# Patient Record
Sex: Male | Born: 1965 | Race: White | Hispanic: No | Marital: Married | Smoking: Current every day smoker
Health system: Southern US, Community
[De-identification: ages and names within clinical notes are randomized; demographics above are authoritative.]

## PROBLEM LIST (undated history)

## (undated) DIAGNOSIS — F509 Eating disorder, unspecified: Secondary | ICD-10-CM

## (undated) DIAGNOSIS — I2510 Atherosclerotic heart disease of native coronary artery without angina pectoris: Secondary | ICD-10-CM

## (undated) DIAGNOSIS — Z87898 Personal history of other specified conditions: Secondary | ICD-10-CM

## (undated) DIAGNOSIS — I639 Cerebral infarction, unspecified: Secondary | ICD-10-CM

## (undated) DIAGNOSIS — F319 Bipolar disorder, unspecified: Secondary | ICD-10-CM

## (undated) DIAGNOSIS — E291 Testicular hypofunction: Secondary | ICD-10-CM

## (undated) DIAGNOSIS — F4310 Post-traumatic stress disorder, unspecified: Secondary | ICD-10-CM

## (undated) DIAGNOSIS — F32A Depression, unspecified: Secondary | ICD-10-CM

## (undated) DIAGNOSIS — I219 Acute myocardial infarction, unspecified: Secondary | ICD-10-CM

## (undated) DIAGNOSIS — Z800 Family history of malignant neoplasm of digestive organs: Secondary | ICD-10-CM

## (undated) DIAGNOSIS — F419 Anxiety disorder, unspecified: Secondary | ICD-10-CM

## (undated) DIAGNOSIS — I10 Essential (primary) hypertension: Secondary | ICD-10-CM

## (undated) DIAGNOSIS — Z860100 Personal history of colon polyps, unspecified: Secondary | ICD-10-CM

## (undated) DIAGNOSIS — K219 Gastro-esophageal reflux disease without esophagitis: Secondary | ICD-10-CM

## (undated) DIAGNOSIS — E041 Nontoxic single thyroid nodule: Secondary | ICD-10-CM

## (undated) HISTORY — DX: Atherosclerotic heart disease of native coronary artery without angina pectoris: I25.10

## (undated) HISTORY — PX: ROTATOR CUFF REPAIR: SHX139

## (undated) HISTORY — DX: Depression, unspecified: F32.A

## (undated) HISTORY — DX: Personal history of colon polyps, unspecified: Z86.0100

## (undated) HISTORY — PX: UPPER GASTROINTESTINAL ENDOSCOPY: SHX188

## (undated) HISTORY — DX: Anxiety disorder, unspecified: F41.9

## (undated) HISTORY — DX: Family history of malignant neoplasm of digestive organs: Z80.0

## (undated) HISTORY — PX: COLONOSCOPY: SHX174

## (undated) HISTORY — DX: Eating disorder, unspecified: F50.9

## (undated) HISTORY — DX: Gastro-esophageal reflux disease without esophagitis: K21.9

## (undated) HISTORY — PX: CORONARY ANGIOPLASTY WITH STENT PLACEMENT: SHX49

## (undated) HISTORY — DX: Bipolar disorder, unspecified: F31.9

## (undated) HISTORY — DX: Personal history of colonic polyps: Z86.010

## (undated) HISTORY — DX: Testicular hypofunction: E29.1

## (undated) HISTORY — DX: Nontoxic single thyroid nodule: E04.1

---

## 1996-04-25 HISTORY — PX: APPENDECTOMY: SHX54

## 2014-07-01 LAB — HM COLONOSCOPY

## 2019-02-21 ENCOUNTER — Other Ambulatory Visit: Payer: Self-pay

## 2019-02-21 ENCOUNTER — Emergency Department (HOSPITAL_COMMUNITY): Payer: Managed Care, Other (non HMO)

## 2019-02-21 ENCOUNTER — Encounter (HOSPITAL_COMMUNITY): Payer: Self-pay | Admitting: Physician Assistant

## 2019-02-21 ENCOUNTER — Emergency Department (HOSPITAL_COMMUNITY)
Admission: EM | Admit: 2019-02-21 | Discharge: 2019-02-21 | Disposition: A | Discharge status: Home / Self Care | Payer: Managed Care, Other (non HMO) | Attending: Emergency Medicine | Admitting: Emergency Medicine

## 2019-02-21 DIAGNOSIS — R5383 Other fatigue: Secondary | ICD-10-CM

## 2019-02-21 DIAGNOSIS — Z20828 Contact with and (suspected) exposure to other viral communicable diseases: Secondary | ICD-10-CM | POA: Diagnosis not present

## 2019-02-21 DIAGNOSIS — R509 Fever, unspecified: Secondary | ICD-10-CM | POA: Diagnosis present

## 2019-02-21 DIAGNOSIS — Z20822 Contact with and (suspected) exposure to covid-19: Secondary | ICD-10-CM

## 2019-02-21 LAB — COMPREHENSIVE METABOLIC PANEL
ALT: 18 U/L (ref 0–44)
AST: 18 U/L (ref 15–41)
Albumin: 4.2 g/dL (ref 3.5–5.0)
Alkaline Phosphatase: 66 U/L (ref 38–126)
Anion gap: 8 (ref 5–15)
BUN: 11 mg/dL (ref 6–20)
CO2: 24 mmol/L (ref 22–32)
Calcium: 9.6 mg/dL (ref 8.9–10.3)
Chloride: 109 mmol/L (ref 98–111)
Creatinine, Ser: 1.29 mg/dL — ABNORMAL HIGH (ref 0.61–1.24)
GFR calc Af Amer: >60 mL/min (ref >60)
GFR calc non Af Amer: >60 mL/min (ref >60)
Glucose, Bld: 85 mg/dL (ref 70–99)
Potassium: 4.1 mmol/L (ref 3.5–5.1)
Sodium: 141 mmol/L (ref 135–145)
Total Bilirubin: 0.6 mg/dL (ref 0.3–1.2)
Total Protein: 6.1 g/dL — ABNORMAL LOW (ref 6.5–8.1)

## 2019-02-21 LAB — CBC WITH DIFFERENTIAL/PLATELET
Abs Immature Granulocytes: 0.03 10*3/uL (ref 0.00–0.07)
Basophils Absolute: 0.1 10*3/uL (ref 0.0–0.1)
Basophils Relative: 1 %
Eosinophils Absolute: 0.2 10*3/uL (ref 0.0–0.5)
Eosinophils Relative: 2 %
HCT: 44.3 % (ref 39.0–52.0)
Hemoglobin: 14 g/dL (ref 13.0–17.0)
Immature Granulocytes: 0 %
Lymphocytes Relative: 22 %
Lymphs Abs: 1.9 10*3/uL (ref 0.7–4.0)
MCH: 29.9 pg (ref 26.0–34.0)
MCHC: 31.6 g/dL (ref 30.0–36.0)
MCV: 94.7 fL (ref 80.0–100.0)
Monocytes Absolute: 0.6 10*3/uL (ref 0.1–1.0)
Monocytes Relative: 7 %
Neutro Abs: 5.8 10*3/uL (ref 1.7–7.7)
Neutrophils Relative %: 68 %
Platelets: 327 10*3/uL (ref 150–400)
RBC: 4.68 MIL/uL (ref 4.22–5.81)
RDW: 14.1 % (ref 11.5–15.5)
WBC: 8.5 10*3/uL (ref 4.0–10.5)
nRBC: 0 % (ref 0.0–0.2)

## 2019-02-21 LAB — TROPONIN I (HIGH SENSITIVITY): Troponin I (High Sensitivity): 2 ng/L (ref <18)

## 2019-02-21 LAB — LITHIUM LEVEL: Lithium Lvl: 0.62 mmol/L (ref 0.60–1.20)

## 2019-02-21 LAB — D-DIMER, QUANTITATIVE: D-Dimer, Quant: <0.27 ug/mL-FEU (ref 0.00–0.50)

## 2019-02-21 MED ORDER — SODIUM CHLORIDE 0.9 % IV BOLUS
1000.0000 mL | Freq: Once | INTRAVENOUS | Status: AC
  Administered 2019-02-21: 1000 mL via INTRAVENOUS

## 2019-02-21 MED ORDER — KETOROLAC TROMETHAMINE 30 MG/ML IJ SOLN
30.0000 mg | Freq: Once | INTRAMUSCULAR | Status: AC
  Administered 2019-02-21: 30 mg via INTRAVENOUS
  Filled 2019-02-21: qty 1

## 2019-02-21 NOTE — ED Triage Notes (Signed)
Pt to ER for evaluation of fever and fatigue with nasal congestion. Reports travels for work. Became ill with symptoms on Friday, was tested for COVID Sunday but has not gotten the results.

## 2019-02-21 NOTE — ED Notes (Signed)
Pt dc'd home w/all belongings, a/o x4, ambulatory on dc

## 2019-02-21 NOTE — Discharge Instructions (Signed)
Today you were were tested for the coronavirus.  The results will be available in the next 3 to 5 days.  If the results are positive the hospital will contact you.  If they are negative the hospital would not contact you.  You will need to self quarantine until you are aware of your results.  If they are positive you will need to self quarantine as directed below.  You should be isolated for at least 7 days since the onset of your symptoms AND >72 hours after symptoms resolution (absence of fever without the use of fever reducing medication and improvement in respiratory symptoms), whichever is longer  Please follow up with your primary care provider within 5-7 days for re-evaluation of your symptoms. If you do not have a primary care provider, information for a healthcare clinic has been provided for you to make arrangements for follow up care. Please return to the emergency department for any new or worsening symptoms.

## 2019-02-21 NOTE — ED Provider Notes (Signed)
[facility] EMERGENCY DEPARTMENT Provider Note   CSN: [id number] Arrival date & time: 02/21/19  1334     History   Chief Complaint Chief Complaint  Patient presents with  . Fever  . Fatigue    HPI Louis Cardenas is a 53 y.o. male.     HPI   Pt is a 53 y/o male with a h/o bipolar disorder, bulimia, MI (at age 17), sinusitis, who presents to the ED today for eval of fatigue, body aches, fever (resolved 5 days ago), that started about a week ago. He started having nasal congestion about 2 weeks ago.   He also c/o right back pain/right chest pain that started last night. It has been intermittent. Pain feels like "I got punched".  No associated with exertion. Denies pain currently.  Denies SOB, pleuritic pain or cough. Has had similar pain in the past that resolved on its own. He thinks he may have slept wrong. Pain worse when lifting up the right arm sometimes.  He was tested for COVID 10/25 but does not have the results yet.    States that he traveled for 2.5 weeks recently. States that he has driven 2-3 hours max at a time during this period. He has been around people who have had family members that were positive for COVID. Denies leg pain/swelling, hemoptysis, recent surgery/trauma, hormone use, personal hx of cancer, or hx of DVT/PE.    History reviewed. No pertinent past medical history.  There are no active problems to display for this patient.   History reviewed. No pertinent surgical history.      Home Medications    Prior to Admission medications   Not on File    Family History History reviewed. No pertinent family history.  Social History Social History   Tobacco Use  . Smoking status: Not on file  Substance Use Topics  . Alcohol use: Not on file  . Drug use: Not on file     Allergies   Patient has no allergy information on record.   Review of Systems Review of Systems  Constitutional: Positive for fever.  HENT:  Positive for congestion. Negative for ear pain and sore throat.   Eyes: Negative for pain and visual disturbance.  Respiratory: Positive for cough. Negative for shortness of breath.   Cardiovascular: Positive for chest pain.  Gastrointestinal: Positive for nausea. Negative for abdominal pain, constipation, diarrhea and vomiting.  Genitourinary: Negative for dysuria and hematuria.  Musculoskeletal: Positive for back pain.  Skin: Negative for rash.  Neurological: Negative for headaches.  All other systems reviewed and are negative.    Physical Exam Updated Vital Signs BP 118/75 (BP Location: Right Arm)   Pulse 65   Temp 98.7 F (37.1 C) (Oral)   Resp 18   SpO2 99%   Physical Exam Vitals signs and nursing note reviewed.  Constitutional:      Appearance: He is well-developed. He is not ill-appearing.  HENT:     Head: Normocephalic and atraumatic.  Eyes:     Conjunctiva/sclera: Conjunctivae normal.  Neck:     Musculoskeletal: Neck supple.  Cardiovascular:     Rate and Rhythm: Normal rate and regular rhythm.     Pulses: Normal pulses.     Heart sounds: Normal heart sounds. No murmur.  Pulmonary:     Effort: Pulmonary effort is normal. No respiratory distress.     Breath sounds: Normal breath sounds. No wheezing, rhonchi or rales.  Chest:  Chest wall: No tenderness.  Abdominal:     General: Bowel sounds are normal.     Palpations: Abdomen is soft.     Tenderness: There is no abdominal tenderness. There is no guarding or rebound.  Musculoskeletal:     Comments: Mild ttp inferior to the right scapula  Skin:    General: Skin is warm and dry.  Neurological:     Mental Status: He is alert.     Comments: Clear speech, moving all extremities      ED Treatments / Results  Labs (all labs ordered are listed, but only abnormal results are displayed) Labs Reviewed  COMPREHENSIVE METABOLIC PANEL - Abnormal; Notable for the following components:      Result Value    Creatinine, Ser 1.29 (*)    Total Protein 6.1 (*)    All other components within normal limits  NOVEL CORONAVIRUS, NAA (HOSP ORDER, SEND-OUT TO REF LAB; TAT 18-24 HRS)  D-DIMER, QUANTITATIVE (NOT AT [facility])  LITHIUM LEVEL  CBC WITH DIFFERENTIAL/PLATELET  TROPONIN I (HIGH SENSITIVITY)    EKG None  Radiology Dg Chest Portable 1 View  Result Date: 02/21/2019 CLINICAL DATA:  Fever and fatigue EXAM: PORTABLE CHEST 1 VIEW COMPARISON:  None. FINDINGS: The heart size and mediastinal contours are within normal limits. Both lungs are clear. The visualized skeletal structures are unremarkable. IMPRESSION: No active disease. Electronically Signed   By: Abelardo Diesel M.D.   On: 02/21/2019 16:18    Procedures Procedures (including critical care time)  Medications Ordered in ED Medications  sodium chloride 0.9 % bolus 1,000 mL (0 mLs Intravenous Stopped 02/21/19 1742)  ketorolac (TORADOL) 30 MG/ML injection 30 mg (30 mg Intravenous Given 02/21/19 1624)     Initial Impression / Assessment and Plan / ED Course  I have reviewed the triage vital signs and the nursing notes.  Pertinent labs & imaging results that were available during my care of the patient were reviewed by me and considered in my medical decision making (see chart for details).     Final Clinical Impressions(s) / ED Diagnoses   Final diagnoses:  Fatigue, unspecified type  Suspected COVID-19 virus infection   53 year old male presenting for evaluation of fevers and fatigue for the last week.  Is concerned about Covid exposure and has Covid test pending at this time.  His exam is reassuring as well as his vital signs.  Reviewed labs, CBC, CMP, troponin, D-dimer, lithium level are all reassuring. EKG is nonischemic, CXR is negative.   His covid test resulted while in the ED and was negative however he is requesting retesting as he is still concerned. Will send retest. Advised on pcp f/u and quarantine measures. Advised on  return precautions. He voices understanding and is in agreement, all questions answered, pt stable for d/c.   ED Discharge Orders    None       Rodney Booze, PA-C 02/21/19 [place], Ankit, MD 02/21/19 2340

## 2019-02-23 LAB — NOVEL CORONAVIRUS, NAA (HOSP ORDER, SEND-OUT TO REF LAB; TAT 18-24 HRS): SARS-CoV-2, NAA: NOT DETECTED

## 2019-06-05 LAB — BASIC METABOLIC PANEL
BUN: 16 (ref 4–21)
Creatinine: 1.4 — AB (ref 0.6–1.3)

## 2019-06-05 LAB — TSH: TSH: 0.58 (ref 0.41–5.90)

## 2019-07-22 LAB — T4, FREE: Free T4: 1.32

## 2019-07-22 LAB — TESTOSTERONE: Testosterone: 546

## 2019-07-22 LAB — TESTOSTERONE, FREE: Free Testosterone: 7.2

## 2019-07-22 LAB — T3: T3, Total: 21

## 2019-07-24 ENCOUNTER — Other Ambulatory Visit: Payer: Self-pay

## 2019-07-29 ENCOUNTER — Encounter: Payer: Self-pay | Admitting: Endocrinology

## 2019-07-29 ENCOUNTER — Ambulatory Visit (INDEPENDENT_AMBULATORY_CARE_PROVIDER_SITE_OTHER): Payer: BC Managed Care – PPO | Admitting: Endocrinology

## 2019-07-29 ENCOUNTER — Other Ambulatory Visit: Payer: Self-pay

## 2019-07-29 DIAGNOSIS — E041 Nontoxic single thyroid nodule: Secondary | ICD-10-CM | POA: Diagnosis not present

## 2019-07-29 NOTE — Progress Notes (Signed)
Subjective:    Patient ID: Louis Cardenas, male    DOB: 1966-04-02, 54 y.o.   MRN: [id number]  HPI Pt is referred by Dr Lenard Simmer, for nodular thyroid.  Pt was noted to have a thyroid nodule in early 2021.  He is unaware of ever having had thyroid problems in the past.  He has no h/o XRT or surgery to the neck.  He has slight neck pain.  Past Medical History:  Diagnosis Date  . Thyroid nodule     No past surgical history on file.  Social History   Socioeconomic History  . Marital status: Married    Spouse name: Not on file  . Number of children: Not on file  . Years of education: Not on file  . Highest education level: Not on file  Occupational History  . Not on file  Tobacco Use  . Smoking status: Current Every Day Smoker    Packs/day: 0.50    Types: Cigarettes  . Smokeless tobacco: Never Used  Substance and Sexual Activity  . Alcohol use: Yes    Comment: socially  . Drug use: Yes    Types: Marijuana  . Sexual activity: Not Currently  Other Topics Concern  . Not on file  Social History Narrative  . Not on file   Social Determinants of Health   Financial Resource Strain:   . Difficulty of Paying Living Expenses:   Food Insecurity:   . Worried About Charity fundraiser in the Last Year:   . Arboriculturist in the Last Year:   Transportation Needs:   . Film/video editor (Medical):   Marland Kitchen Lack of Transportation (Non-Medical):   Physical Activity:   . Days of Exercise per Week:   . Minutes of Exercise per Session:   Stress:   . Feeling of Stress :   Social Connections:   . Frequency of Communication with Friends and Family:   . Frequency of Social Gatherings with Friends and Family:   . Attends Religious Services:   . Active Member of Clubs or Organizations:   . Attends Archivist Meetings:   Marland Kitchen Marital Status:   Intimate Partner Violence:   . Fear of Current or Ex-Partner:   . Emotionally Abused:   Marland Kitchen Physically Abused:   . Sexually Abused:      Current Outpatient Medications on File Prior to Visit  Medication Sig Dispense Refill  . aspirin 81 MG chewable tablet Chew by mouth daily.    Marland Kitchen atorvastatin (LIPITOR) 40 MG tablet Take 40 mg by mouth daily.    . clonazePAM (KLONOPIN) 0.5 MG tablet Take 0.5 mg by mouth as needed for anxiety.    . clopidogrel (PLAVIX) 75 MG tablet Take 75 mg by mouth daily.    Marland Kitchen lamoTRIgine (LAMICTAL) 200 MG tablet Take 200 mg by mouth daily.    Marland Kitchen lithium 300 MG tablet Take 300 mg by mouth daily.     . nitroGLYCERIN (NITROSTAT) 0.4 MG SL tablet Place 0.4 mg under the tongue every 5 (five) minutes as needed for chest pain.    . pantoprazole (PROTONIX) 40 MG tablet Take 40 mg by mouth daily.    . sildenafil (VIAGRA) 100 MG tablet Take 100 mg by mouth daily as needed for erectile dysfunction.    . Testosterone 40.5 MG/2.5GM (1.62%) GEL Place onto the skin daily. Apply 1 packet daily     No current facility-administered medications on file prior to visit.  No Known Allergies  Family History  Problem Relation Age of Onset  . Thyroid disease Mother     BP 100/70   Pulse 63   Ht 5\' 6"  (1.676 m)   Wt 138 lb 3.2 oz (62.7 kg)   SpO2 98%   BMI 22.31 kg/m    Review of Systems Denies hoarseness, sob, cough, dysphagia, diarrhea, and flushing.  He reports cold intolerance, difficulty with concentration, fatigue, tremor, and headache.  He has lost 100 lbs, due to his efforts (he has also been dx'ed with bulemia).     Objective:   Physical Exam VS: see vs page GEN: no distress HEAD: head: no deformity eyes: no periorbital swelling, no proptosis external nose and ears are normal NECK: supple, thyroid is not enlarged.  I cannot appreciate the nodule. CHEST WALL: no deformity LUNGS: clear to auscultation CV: reg rate and rhythm, no murmur.  MUSCULOSKELETAL: muscle bulk and strength are grossly normal.  no obvious joint swelling.  gait is normal and steady EXTEMITIES: no deformity.  no edema PULSES:  no carotid bruit NEURO:  cn 2-12 grossly intact.   readily moves all 4's.  sensation is intact to touch on all 4's SKIN:  Normal texture and temperature.  No rash or suspicious lesion is visible.   NODES:  None palpable at the neck PSYCH: alert, well-oriented.  Does not appear anxious nor depressed.     Lab Results  Component Value Date   TSH 0.58 06/05/2019   T3TOTAL 21 06/05/2019   I have reviewed outside records, and summarized:  Pt was noted to have a goiter when he lived in [place], and was told to follow up here in [place].  He reported sxs of tremor, unsteadiness of gait, and memory loss.    [place]: RLP nodule, 1 cm diameter.      Assessment & Plan:  Thyroid nodule, new to me.  Cold intolerance, and other sxs.   Patient Instructions  You don't have to worry about the thyroid causing your symptoms. Please come back for a follow-up appointment in 1 year.   most of the time, a "lumpy thyroid" will eventually become overactive.  this is usually a slow process, happening over the span of many years.

## 2019-07-29 NOTE — Patient Instructions (Signed)
You don't have to worry about the thyroid causing your symptoms. Please come back for a follow-up appointment in 1 year.   most of the time, a "lumpy thyroid" will eventually become overactive.  this is usually a slow process, happening over the span of many years.

## 2019-08-01 ENCOUNTER — Encounter: Payer: Self-pay | Admitting: Endocrinology

## 2019-08-01 DIAGNOSIS — E041 Nontoxic single thyroid nodule: Secondary | ICD-10-CM | POA: Insufficient documentation

## 2019-10-02 ENCOUNTER — Other Ambulatory Visit: Payer: Self-pay

## 2019-10-02 ENCOUNTER — Ambulatory Visit (INDEPENDENT_AMBULATORY_CARE_PROVIDER_SITE_OTHER): Payer: BC Managed Care – PPO | Admitting: Internal Medicine

## 2019-10-02 VITALS — BP 116/70 | HR 64 | Temp 97.8°F | Ht 66.0 in | Wt 139.2 lb

## 2019-10-02 DIAGNOSIS — M25511 Pain in right shoulder: Secondary | ICD-10-CM

## 2019-10-02 DIAGNOSIS — M542 Cervicalgia: Secondary | ICD-10-CM

## 2019-10-02 DIAGNOSIS — I25119 Atherosclerotic heart disease of native coronary artery with unspecified angina pectoris: Secondary | ICD-10-CM

## 2019-10-02 MED ORDER — CYCLOBENZAPRINE HCL 10 MG PO TABS: 10.0000 mg | ORAL_TABLET | Freq: Three times a day (TID) | ORAL | 0 refills | Status: DC | PRN

## 2019-10-02 MED ORDER — DICLOFENAC SODIUM 1 % EX GEL: 4.0000 g | Freq: Four times a day (QID) | CUTANEOUS | 0 refills | Status: DC

## 2019-10-02 NOTE — Assessment & Plan Note (Addendum)
First visit in [facility] 2. Rt shoulder/neck pain is a new problem.  His pain is started about a week ago, it is sharp pain at rt shoulder blade and lateral of neck without any known trauma that started gradually at the night after he drove a car for about 3 hours.  It got worse in the morning and he has associated numbness in first 3 digits. he thinks he had so much stress in his life recently and believes it caused his muscle gets tense.  No weakness.  His symptoms have been constant. Tylenol helps but it is temporary.   His presentation is suggestive of cervicalgia. Will manage conservatively with muscle relaxant and pain control/anti-inflammatory. -Flexeril 10 mg 3 times daily as needed for a week -Voltaren gel as needed -Follow-up in clinic if no improvement in 2 weeks

## 2019-10-02 NOTE — Progress Notes (Signed)
New Patient Office Visit  Subjective:  Patient ID: Louis Cardenas, male    DOB: 06-25-1965  Age: 54 y.o. MRN: [id number]  CC:  Chief Complaint  Patient presents with  . Establish Care  . Shoulder Pain    HPI Louis Cardenas male with thyroid disease, CAD sp heart attack June 2001 s/p stent, bipolar disease, pesents for rt shoulder/neck pain and numbness of fingers.  Please refer to problem based charting for further details and assessment and plan.  Past Medical History:  Diagnosis Date  . Thyroid nodule    No past surgical history on file.  Surgical Hx: Rotator cuff surgery at left arm  Family History  Problem Relation Age of Onset  . Thyroid disease Mother    FHx: Colon cancer in father  Social Hx: Smoke 10-20 cigarettes a day. Smokes Marijuana some times after work. Drinks alcohol socially He moved to [place] from [place] recently, lives alone and works as a Freight forwarder at [employer]   Social History   Socioeconomic History  . Marital status: Married    Spouse name: Not on file  . Number of children: Not on file  . Years of education: Not on file  . Highest education level: Not on file  Occupational History  . Not on file  Tobacco Use  . Smoking status: Current Every Day Smoker    Packs/day: 1.00    Types: Cigarettes  . Smokeless tobacco: Never Used  . Tobacco comment: .5 to 1 packs per day   Substance and Sexual Activity  . Alcohol use: Yes    Comment: socially  . Drug use: Yes    Types: Marijuana  . Sexual activity: Not Currently  Other Topics Concern  . Not on file  Social History Narrative  . Not on file   Social Determinants of Health   Financial Resource Strain:   . Difficulty of Paying Living Expenses:   Food Insecurity:   . Worried About Charity fundraiser in the Last Year:   . Arboriculturist in the Last Year:   Transportation Needs:   . Film/video editor (Medical):   Marland Kitchen Lack of Transportation (Non-Medical):    Physical Activity:   . Days of Exercise per Week:   . Minutes of Exercise per Session:   Stress:   . Feeling of Stress :   Social Connections:   . Frequency of Communication with Friends and Family:   . Frequency of Social Gatherings with Friends and Family:   . Attends Religious Services:   . Active Member of Clubs or Organizations:   . Attends Archivist Meetings:   Marland Kitchen Marital Status:   Intimate Partner Violence:   . Fear of Current or Ex-Partner:   . Emotionally Abused:   Marland Kitchen Physically Abused:   . Sexually Abused:     ROS Review of Systems  Objective:   Today's Vitals: BP 116/70 (BP Location: Left Arm, Patient Position: Sitting, Cuff Size: Normal)   Pulse 64   Temp 97.8 F (36.6 C) (Oral)   Ht 5\' 6"  (1.676 m)   Wt 139 lb 3.2 oz (63.1 kg)   SpO2 100% Comment: room air  BMI 22.47 kg/m   Physical Exam Musculoskeletal:     Right shoulder: No deformity or tenderness. Normal range of motion. Normal strength.     Left shoulder: No deformity or tenderness. Normal range of motion. Normal strength.     Right hand: Normal pulse.  Left hand: Decreased sensation. Normal pulse.       Arms:     Cervical back: Normal range of motion. No edema. Muscular tenderness present. No spinous process tenderness.    Physical Exam  Musculoskeletal:     Right shoulder: No deformity or tenderness. Normal range of motion. Normal strength.     Left shoulder: No deformity or tenderness. Normal range of motion. Normal strength.     Right hand: Normal pulse.     Left hand: Decreased sensation. Normal pulse.       Arms:     Cervical back: Normal range of motion. No edema. Muscular tenderness present. No spinous process tenderness.   Assessment & Plan:   Problem List Items Addressed This Visit    None      Outpatient Encounter Medications as of 10/02/2019  Medication Sig  . aspirin 81 MG chewable tablet Chew by mouth daily.  Marland Kitchen atorvastatin (LIPITOR) 40 MG tablet Take 40 mg  by mouth daily.  . clonazePAM (KLONOPIN) 0.5 MG tablet Take 0.5 mg by mouth as needed for anxiety.  . clopidogrel (PLAVIX) 75 MG tablet Take 75 mg by mouth daily.  Marland Kitchen lamoTRIgine (LAMICTAL) 200 MG tablet Take 200 mg by mouth daily.  Marland Kitchen lithium 300 MG tablet Take 300 mg by mouth daily.   . nitroGLYCERIN (NITROSTAT) 0.4 MG SL tablet Place 0.4 mg under the tongue every 5 (five) minutes as needed for chest pain.  . pantoprazole (PROTONIX) 40 MG tablet Take 40 mg by mouth daily.  . sildenafil (VIAGRA) 100 MG tablet Take 100 mg by mouth daily as needed for erectile dysfunction.  . Testosterone 40.5 MG/2.5GM (1.62%) GEL Place onto the skin daily. Apply 1 packet daily   No facility-administered encounter medications on file as of 10/02/2019.  Recently started Tintrellix 10 mg QD now up to 20 mg QD  Follow-up: No follow-ups on file.   Dewayne Hatch, MD

## 2019-10-02 NOTE — Patient Instructions (Addendum)
Thank you for allowing [place] to provide your care today.  Your neck/shoulder pain and your fingers numbness are likely due to muscle spasm at your neck and pinched nerve.  Use topical Voltaren gel 3 times a day as needed. Take Flexeril 10 mg up to 3 times a day as needed. (It may make you drowsy, so do not take it with Clonazepam or other sedating medicine and do not drive just after taking that). As always, if your symptoms got worse (if sever pain, worsening of numbness, or any weakness) please go to emergency room.  Follow up in clinic in 2-4 weeks to see your primary care doctor and establish care.  Should you have any questions or concerns please call the internal medicine clinic at [phone].

## 2019-10-03 ENCOUNTER — Encounter: Payer: Self-pay | Admitting: Internal Medicine

## 2019-10-03 DIAGNOSIS — I2510 Atherosclerotic heart disease of native coronary artery without angina pectoris: Secondary | ICD-10-CM | POA: Insufficient documentation

## 2019-10-03 NOTE — Assessment & Plan Note (Signed)
Reports compliance to aspirin and Plavix.  No bleeding.

## 2019-10-04 NOTE — Progress Notes (Signed)
Internal Medicine Clinic Attending  Case discussed with Dr. Masoudi  at the time of the visit.  We reviewed the resident's history and exam and pertinent patient test results.  I agree with the assessment, diagnosis, and plan of care documented in the resident's note.  

## 2019-10-16 ENCOUNTER — Ambulatory Visit: Payer: Managed Care, Other (non HMO)

## 2019-10-22 ENCOUNTER — Ambulatory Visit (INDEPENDENT_AMBULATORY_CARE_PROVIDER_SITE_OTHER): Payer: BC Managed Care – PPO | Admitting: Internal Medicine

## 2019-10-22 ENCOUNTER — Other Ambulatory Visit: Payer: Self-pay

## 2019-10-22 DIAGNOSIS — G5601 Carpal tunnel syndrome, right upper limb: Secondary | ICD-10-CM | POA: Insufficient documentation

## 2019-10-22 NOTE — Assessment & Plan Note (Addendum)
Louis Cardenas presents to the clinic with c/o 1 month history of numbness in his right thumb, 2nd and 3rd digit. He denies numbness in any other fingers. He notes occasional pain in those affected fingers. No trauma or falls onto wrist. He denies any joint pain, swelling or redness. 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   Assessment/Plan:  History and physical consistent with carpal tunnel syndrome. Likely secondary to his frequent keyboard use as, no history of hypothyroidism, diabetes, recent weight gain.   Will begin with conservative treatment prior to referral to hand surgery.   - Right wrist brace, wear daily and at nighttime as often as possible - Keyboard support  - 4 week follow up

## 2019-10-22 NOTE — Patient Instructions (Signed)
It was nice seeing you today! Thank you for choosing [facility].    Today we talked about:   1. Carpal Tunnel Syndrome (Right wrist): The numbness in your fingers are from irritation of the median nerve. We will start with conservative treatment prior to considering surgery. For this, wear a wrist brace at night time and as much as work as possible. In addition, use a cushion on your keyboard to support your wrist.

## 2019-10-22 NOTE — Progress Notes (Signed)
   CC: Right finger numbness  HPI:  [name] is a 54 y.o. with a PMHx as listed below who presents to the clinic for right finger numbness.   Please see the Encounters tab for problem-based Assessment & Plan regarding status of patient's chronic conditions.  Past Medical History:  Diagnosis Date  . Thyroid nodule    Review of Systems: Review of Systems  Constitutional: Negative for chills and fever.  Musculoskeletal: Negative for falls and joint pain.  Neurological: Positive for sensory change. Negative for focal weakness and weakness.   Physical Exam:  Vitals:   10/22/19 1100  BP: 123/79  Pulse: (!) 56  Temp: 98 F (36.7 C)  TempSrc: Oral  SpO2: 99%  Weight: 139 lb 11.2 oz (63.4 kg)  Height: 5\' 6"  (1.676 m)   Physical Exam Vitals and nursing note reviewed.  Constitutional:      Appearance: He is normal weight.  Neurological:     Mental Status: He is alert and oriented to person, place, and time.     Comments: Decreased sensation of the right thumb, 2nd and 3rd digit. No digit weakness on right hand.   Psychiatric:        Mood and Affect: Mood normal.        Behavior: Behavior normal.    Assessment & Plan:   See Encounters Tab for problem based charting.  Patient discussed with Dr. Philipp Ovens

## 2019-10-23 NOTE — Progress Notes (Signed)
Internal Medicine Clinic Attending  Case discussed with Dr. Basaraba at the time of the visit.  We reviewed the resident's history and exam and pertinent patient test results.  I agree with the assessment, diagnosis, and plan of care documented in the resident's note.    

## 2019-12-06 ENCOUNTER — Telehealth: Payer: Self-pay | Admitting: *Deleted

## 2019-12-06 NOTE — Telephone Encounter (Signed)
Information was sent through CoverMyMeds for PA for Diclofenac Gel1%.  Approved 11/06/2019 thru 8:13/2022.  Sander Nephew, RN 12/06/2019 11:00 AM.

## 2020-01-22 ENCOUNTER — Other Ambulatory Visit: Payer: Self-pay

## 2020-01-22 ENCOUNTER — Ambulatory Visit (INDEPENDENT_AMBULATORY_CARE_PROVIDER_SITE_OTHER): Payer: BC Managed Care – PPO | Admitting: Internal Medicine

## 2020-01-22 ENCOUNTER — Encounter: Payer: Self-pay | Admitting: Internal Medicine

## 2020-01-22 DIAGNOSIS — K115 Sialolithiasis: Secondary | ICD-10-CM | POA: Insufficient documentation

## 2020-01-22 NOTE — Progress Notes (Signed)
   CC: Swollen cheek and neck   HPI:  [name] is a 54 y.o. male, with a PMH noted below, who presents to the clinic for swollen neck and cheek. To see the management of their acute and chronic conditions, please see the A&P note under the Encounters tab.   Past Medical History:  Diagnosis Date  . Thyroid nodule    Review of Systems:   Review of Systems  Constitutional: Negative for chills, fever, malaise/fatigue and weight loss.  HENT: Negative for congestion, sinus pain and sore throat.   Respiratory: Negative for cough, hemoptysis and sputum production.   Cardiovascular: Negative for chest pain, palpitations and orthopnea.  Gastrointestinal: Negative for abdominal pain, constipation, diarrhea, nausea and vomiting.  Skin: Negative for itching and rash.    Physical Exam:  Vitals:   01/22/20 1513  BP: 112/76  Pulse: 80  Temp: 98.2 F (36.8 C)  TempSrc: Oral  SpO2: 99%  Weight: 130 lb 14.4 oz (59.4 kg)  Height: 5\' 6"  (1.676 m)   Physical Exam Constitutional:      General: He is not in acute distress.    Appearance: Normal appearance. He is not ill-appearing or toxic-appearing.  HENT:     Head: Normocephalic and atraumatic.     Jaw: Tenderness and swelling present.      Comments: Tenderness and swelling of the parotid and submandibular gland on the L side. No lymphadenopathy noted.     Mouth/Throat:     Mouth: Mucous membranes are moist.     Pharynx: No oropharyngeal exudate or posterior oropharyngeal erythema.     Comments: Mucous membranes moist with no signs of lesions or erythema.  Cardiovascular:     Rate and Rhythm: Normal rate and regular rhythm.  Pulmonary:     Effort: Pulmonary effort is normal.     Breath sounds: Normal breath sounds.  Neurological:     Mental Status: He is alert.     Assessment & Plan:   See Encounters Tab for problem based charting.  Patient discussed with Dr. Daryll Drown

## 2020-01-22 NOTE — Assessment & Plan Note (Addendum)
Patient presents to the clinic for swelling of his L cheek and under his L jaw. Patient states that he recently came back from a 2 week vacation to [place] and [place]. He states that his symptoms started approximately a week and half ago. He states that his left cheek and the left underside of his jaw had been swelling. He states that the pain would cause him headaches if he would bite down hard. Denies fevers, vision changes, chest pain, abdominal pain, n/v. He had been taking tylenol with little relief. He had also been taking intermittent NSAIDs PRN. He states that the lower jaw swelling fluctuates, and at its largest it was the side of a golfball. Patient does state that he did not try any new foods on his trip, no IV drugs, or new sexual partners. On physical examination he is tender over the parotid and submandibular glands. Likely sialolithiasis given presentation and physical examination.  - Conservative management, sour candies, warm compresses, massaging affected glands.  - Provided information packet on salivary stones.

## 2020-01-22 NOTE — Patient Instructions (Addendum)
Louis Cardenas,  It was a pleasure meeting you today. After listening to your history, and doing a physical examination, it appears that you may have a salivary stone. Please suck on sour candies (Lemon drop/lemon heads/sour patch) to stimulate saliva production. Additionally apply warm compresses to your cheek and below your jaw, and massage the gland to help the stone pass. Please come back if you begin to have fevers, chills, or worsening swelling. Have a good day!  Sincerely,  Maudie Mercury, MD

## 2020-01-27 NOTE — Progress Notes (Signed)
Internal Medicine Clinic Attending ? ?Case discussed with Dr. Winters  At the time of the visit.  We reviewed the resident?s history and exam and pertinent patient test results.  I agree with the assessment, diagnosis, and plan of care documented in the resident?s note.  ?

## 2020-07-08 ENCOUNTER — Encounter: Payer: Self-pay | Admitting: Internal Medicine

## 2020-07-08 ENCOUNTER — Ambulatory Visit: Payer: BC Managed Care – PPO | Admitting: Internal Medicine

## 2020-07-08 ENCOUNTER — Other Ambulatory Visit: Payer: Self-pay

## 2020-07-08 ENCOUNTER — Encounter (INDEPENDENT_AMBULATORY_CARE_PROVIDER_SITE_OTHER): Payer: Self-pay

## 2020-07-08 VITALS — BP 110/64 | HR 68 | Temp 98.2°F | Ht 66.0 in | Wt 142.0 lb

## 2020-07-08 DIAGNOSIS — R351 Nocturia: Secondary | ICD-10-CM | POA: Diagnosis not present

## 2020-07-08 DIAGNOSIS — N401 Enlarged prostate with lower urinary tract symptoms: Secondary | ICD-10-CM | POA: Diagnosis not present

## 2020-07-08 DIAGNOSIS — I2510 Atherosclerotic heart disease of native coronary artery without angina pectoris: Secondary | ICD-10-CM

## 2020-07-08 DIAGNOSIS — Z23 Encounter for immunization: Secondary | ICD-10-CM | POA: Diagnosis not present

## 2020-07-08 DIAGNOSIS — Z0000 Encounter for general adult medical examination without abnormal findings: Secondary | ICD-10-CM | POA: Diagnosis not present

## 2020-07-08 DIAGNOSIS — E291 Testicular hypofunction: Secondary | ICD-10-CM

## 2020-07-08 DIAGNOSIS — F319 Bipolar disorder, unspecified: Secondary | ICD-10-CM | POA: Diagnosis not present

## 2020-07-08 DIAGNOSIS — Z0001 Encounter for general adult medical examination with abnormal findings: Secondary | ICD-10-CM

## 2020-07-08 DIAGNOSIS — E785 Hyperlipidemia, unspecified: Secondary | ICD-10-CM

## 2020-07-08 DIAGNOSIS — K635 Polyp of colon: Secondary | ICD-10-CM

## 2020-07-08 DIAGNOSIS — Z125 Encounter for screening for malignant neoplasm of prostate: Secondary | ICD-10-CM

## 2020-07-08 DIAGNOSIS — Z5181 Encounter for therapeutic drug level monitoring: Secondary | ICD-10-CM

## 2020-07-08 DIAGNOSIS — Z720 Tobacco use: Secondary | ICD-10-CM

## 2020-07-08 LAB — URINALYSIS, ROUTINE W REFLEX MICROSCOPIC
Bilirubin Urine: NEGATIVE
Hgb urine dipstick: NEGATIVE
Ketones, ur: NEGATIVE
Leukocytes,Ua: NEGATIVE
Nitrite: NEGATIVE
RBC / HPF: NONE SEEN (ref 0–?)
Specific Gravity, Urine: 1.01 (ref 1.000–1.030)
Total Protein, Urine: NEGATIVE
Urine Glucose: NEGATIVE
Urobilinogen, UA: 0.2 (ref 0.0–1.0)
pH: 7 (ref 5.0–8.0)

## 2020-07-08 LAB — CBC WITH DIFFERENTIAL/PLATELET
Basophils Absolute: 0.1 10*3/uL (ref 0.0–0.1)
Basophils Relative: 1.2 % (ref 0.0–3.0)
Eosinophils Absolute: 0.1 10*3/uL (ref 0.0–0.7)
Eosinophils Relative: 1.5 % (ref 0.0–5.0)
HCT: 43.9 % (ref 39.0–52.0)
Hemoglobin: 14.5 g/dL (ref 13.0–17.0)
Lymphocytes Relative: 16.4 % (ref 12.0–46.0)
Lymphs Abs: 1.2 10*3/uL (ref 0.7–4.0)
MCHC: 33.2 g/dL (ref 30.0–36.0)
MCV: 84.7 fl (ref 78.0–100.0)
Monocytes Absolute: 0.4 10*3/uL (ref 0.1–1.0)
Monocytes Relative: 5.7 % (ref 3.0–12.0)
Neutro Abs: 5.7 10*3/uL (ref 1.4–7.7)
Neutrophils Relative %: 75.2 % (ref 43.0–77.0)
Platelets: 309 10*3/uL (ref 150.0–400.0)
RBC: 5.18 Mil/uL (ref 4.22–5.81)
RDW: 15.3 % (ref 11.5–15.5)
WBC: 7.5 10*3/uL (ref 4.0–10.5)

## 2020-07-08 LAB — BASIC METABOLIC PANEL
BUN: 15 mg/dL (ref 6–23)
CO2: 28 mEq/L (ref 19–32)
Calcium: 10 mg/dL (ref 8.4–10.5)
Chloride: 109 mEq/L (ref 96–112)
Creatinine, Ser: 1.24 mg/dL (ref 0.40–1.50)
GFR: 66.64 mL/min (ref >60.00)
Glucose, Bld: 97 mg/dL (ref 70–99)
Potassium: 4.5 mEq/L (ref 3.5–5.1)
Sodium: 141 mEq/L (ref 135–145)

## 2020-07-08 LAB — HEPATIC FUNCTION PANEL
ALT: 17 U/L (ref 0–53)
AST: 16 U/L (ref 0–37)
Albumin: 4.5 g/dL (ref 3.5–5.2)
Alkaline Phosphatase: 95 U/L (ref 39–117)
Bilirubin, Direct: 0.1 mg/dL (ref 0.0–0.3)
Total Bilirubin: 0.5 mg/dL (ref 0.2–1.2)
Total Protein: 6.9 g/dL (ref 6.0–8.3)

## 2020-07-08 LAB — LIPID PANEL
Cholesterol: 118 mg/dL (ref 0–200)
HDL: 50.1 mg/dL (ref >39.00)
LDL Cholesterol: 60 mg/dL (ref 0–99)
NonHDL: 68.05
Total CHOL/HDL Ratio: 2
Triglycerides: 42 mg/dL (ref 0.0–149.0)
VLDL: 8.4 mg/dL (ref 0.0–40.0)

## 2020-07-08 LAB — PSA: PSA: 0.48 ng/mL (ref 0.10–4.00)

## 2020-07-08 LAB — TSH: TSH: 0.63 u[IU]/mL (ref 0.35–4.50)

## 2020-07-08 NOTE — Patient Instructions (Signed)

## 2020-07-08 NOTE — Progress Notes (Unsigned)
Subjective:  Patient ID: Louis Cardenas, male    DOB: 01/02/1966  Age: 55 y.o. MRN: [id number]  CC: Annual Exam, Hyperlipidemia, and Coronary Artery Disease  This visit occurred during the SARS-CoV-2 public health emergency.  Safety protocols were in place, including screening questions prior to the visit, additional usage of staff PPE, and extensive cleaning of exam room while observing appropriate contact time as indicated for disinfecting solutions.    HPI Louis Cardenas presents for a CPX and to establish.  He has a complex medical history as listed below.  He recently moved from [place] to [place].  He tells me his symptoms recently have all been well controlled.  He has a history of CAD, he received a stent at the age of 55.  He is active and denies any recent episodes of chest pain, shortness of breath, palpitations, edema, or fatigue.   History Louis Cardenas has a past medical history of Bipolar 1 disorder ([place]), CAD (coronary artery disease), Depressed, Eating disorder, Hypogonadism in male, and Thyroid nodule.   He has a past surgical history that includes Appendectomy (1998).   His family history includes Colon cancer in his father; Depression in his mother; Hypertension in his mother; Thyroid disease in his mother.He reports that he has been smoking cigarettes. He has a 25.00 pack-year smoking history. He has never used smokeless tobacco. He reports current alcohol use of about 2.0 standard drinks of alcohol per week. He reports previous drug use.  Outpatient Medications Prior to Visit  Medication Sig Dispense Refill  . aspirin 81 MG chewable tablet Chew by mouth daily.    Marland Kitchen atorvastatin (LIPITOR) 40 MG tablet Take 40 mg by mouth daily.    . clonazePAM (KLONOPIN) 0.5 MG tablet Take 0.5 mg by mouth as needed for anxiety.    . clopidogrel (PLAVIX) 75 MG tablet Take 75 mg by mouth daily.    . cyclobenzaprine (FLEXERIL) 10 MG tablet Take 1 tablet (10 mg total) by mouth 3  (three) times daily as needed for muscle spasms. This medicine may make you drowsy. Do not take it with clonazepam at the same time. 20 tablet 0  . diclofenac Sodium (VOLTAREN) 1 % GEL Apply 4 g topically 4 (four) times daily. 100 g 0  . lamoTRIgine (LAMICTAL) 200 MG tablet Take 200 mg by mouth daily.    Marland Kitchen lithium 300 MG tablet Take 300 mg by mouth daily.     . nitroGLYCERIN (NITROSTAT) 0.4 MG SL tablet Place 0.4 mg under the tongue every 5 (five) minutes as needed for chest pain.    . pantoprazole (PROTONIX) 40 MG tablet Take 40 mg by mouth daily.    . sildenafil (VIAGRA) 100 MG tablet Take 100 mg by mouth daily as needed for erectile dysfunction.    . Testosterone 40.5 MG/2.5GM (1.62%) GEL Place onto the skin daily. Apply 1 packet daily     No facility-administered medications prior to visit.    ROS Review of Systems  Constitutional: Negative.  Negative for chills, diaphoresis, fatigue and fever.  HENT: Negative.  Negative for sore throat and trouble swallowing.   Eyes: Negative.  Negative for visual disturbance.  Respiratory: Negative for cough, chest tightness, shortness of breath and wheezing.   Cardiovascular: Negative for chest pain, palpitations and leg swelling.  Gastrointestinal: Negative for abdominal pain, diarrhea, nausea and vomiting.  Endocrine: Negative.   Genitourinary: Positive for difficulty urinating. Negative for flank pain, frequency, hematuria, penile swelling, scrotal swelling, testicular pain and urgency.  Nocturia 1-2 times per night  Musculoskeletal: Negative for arthralgias and myalgias.  Skin: Negative.  Negative for color change and pallor.  Neurological: Negative.  Negative for dizziness, weakness, light-headedness and numbness.  Hematological: Negative for adenopathy. Does not bruise/bleed easily.  Psychiatric/Behavioral: Positive for dysphoric mood. Negative for behavioral problems, confusion, decreased concentration, self-injury, sleep disturbance and  suicidal ideas. The patient is nervous/anxious. The patient is not hyperactive.     Objective:  BP 110/64   Pulse 68   Temp 98.2 F (36.8 C) (Oral)   Ht 5\' 6"  (1.676 m)   Wt 142 lb (64.4 kg)   SpO2 98%   BMI 22.92 kg/m   Physical Exam Vitals reviewed.  Constitutional:      Appearance: Normal appearance.  HENT:     Nose: Nose normal.     Mouth/Throat:     Mouth: Mucous membranes are moist.  Eyes:     General: No scleral icterus.    Conjunctiva/sclera: Conjunctivae normal.  Cardiovascular:     Rate and Rhythm: Normal rate and regular rhythm.     Heart sounds: No murmur heard.   Pulmonary:     Effort: Pulmonary effort is normal.     Breath sounds: No stridor. No wheezing, rhonchi or rales.  Abdominal:     General: Abdomen is flat.     Palpations: There is no mass.     Tenderness: There is no abdominal tenderness. There is no guarding or rebound.     Hernia: No hernia is present. There is no hernia in the left inguinal area or right inguinal area.  Genitourinary:    Pubic Area: No rash.      Penis: Normal and circumcised.      Testes: Normal.     Epididymis:     Right: Normal. Not inflamed or enlarged. No mass.     Left: Normal. Not inflamed or enlarged. No mass.     Prostate: Enlarged. Not tender and no nodules present.     Rectum: Normal. Guaiac result negative. No mass, tenderness, anal fissure, external hemorrhoid or internal hemorrhoid. Normal anal tone.  Musculoskeletal:        General: Normal range of motion.     Cervical back: Neck supple.     Right lower leg: No edema.  Lymphadenopathy:     Cervical: No cervical adenopathy.     Lower Body: No right inguinal adenopathy. No left inguinal adenopathy.  Skin:    General: Skin is warm and dry.     Coloration: Skin is not pale.     Findings: No rash.  Neurological:     General: No focal deficit present.     Mental Status: He is alert and oriented to person, place, and time. Mental status is at baseline.   Psychiatric:        Mood and Affect: Mood normal.        Behavior: Behavior normal.        Thought Content: Thought content normal.        Judgment: Judgment normal.     Lab Results  Component Value Date   WBC 7.5 07/08/2020   HGB 14.5 07/08/2020   HCT 43.9 07/08/2020   PLT 309.0 07/08/2020   GLUCOSE 97 07/08/2020   CHOL 118 07/08/2020   TRIG 42.0 07/08/2020   HDL 50.10 07/08/2020   LDLCALC 60 07/08/2020   ALT 17 07/08/2020   AST 16 07/08/2020   NA 141 07/08/2020   K 4.5 07/08/2020  CL 109 07/08/2020   CREATININE 1.24 07/08/2020   BUN 15 07/08/2020   CO2 28 07/08/2020   TSH 0.63 07/08/2020   PSA 0.48 07/08/2020    Assessment & Plan:   Sharlotte Alamo was seen today for annual exam, hyperlipidemia and coronary artery disease.  Diagnoses and all orders for this visit:  Encounter for general adult medical examination with abnormal findings- Exam completed, labs reviewed, vaccines reviewed - He refused a flu vaccine but was agreeable to a Tdap, cancer screenings addressed, patient education was given. -     Lipid panel; Future -     PSA; Future -     HIV Antibody (routine testing w rflx); Future -     Hepatitis C antibody; Future -     Hepatitis C antibody -     HIV Antibody (routine testing w rflx) -     PSA -     Lipid panel  Hyperlipidemia with target LDL less than 70- He has achieved his LDL goal and is doing well on the statin. -     CBC with Differential/Platelet; Future -     Basic metabolic panel; Future -     TSH; Future -     Hepatic function panel; Future -     Hepatic function panel -     TSH -     Basic metabolic panel -     CBC with Differential/Platelet  Coronary artery disease involving native coronary artery of native heart without angina pectoris -     CBC with Differential/Platelet; Future -     Basic metabolic panel; Future -     Ambulatory referral to Cardiology -     Basic metabolic panel -     CBC with Differential/Platelet  BPH  associated with nocturia- His PSA is low and he has minimal symptoms.  He does not wish to take anything to treat this. -     Urinalysis, Routine w reflex microscopic; Future -     Urinalysis, Routine w reflex microscopic  Bipolar 1 disorder ([place])- His lithium level is low.  He has been try being treated by a psychiatrist out of [place]. -     Lithium level; Future -     Lithium level  Therapeutic drug monitoring -     Lithium level; Future -     Lithium level  Hypogonadism male- His testosterone level is in the normal range. -     Testosterone Total,Free,Bio, Males; Future -     Testosterone Total,Free,Bio, Males  Polyp of colon, unspecified part of colon, unspecified type -     Ambulatory referral to Gastroenterology  Tobacco abuse -     Ambulatory Referral for Lung Cancer Scre  Other orders -     Tdap vaccine greater than or equal to 7yo IM   I am having Louis Cardenas [place] maintain his Testosterone, aspirin, atorvastatin, clonazePAM, clopidogrel, lamoTRIgine, lithium, nitroGLYCERIN, pantoprazole, sildenafil, cyclobenzaprine, and diclofenac Sodium.  No orders of the defined types were placed in this encounter.    Follow-up: Return in about 6 months (around 01/08/2021).  Scarlette Calico, MD

## 2020-07-09 ENCOUNTER — Encounter: Payer: Self-pay | Admitting: Internal Medicine

## 2020-07-09 LAB — LITHIUM LEVEL: Lithium Lvl: 0.5 mmol/L — ABNORMAL LOW (ref 0.6–1.2)

## 2020-07-09 LAB — HEPATITIS C ANTIBODY
Hepatitis C Ab: NONREACTIVE
SIGNAL TO CUT-OFF: 0 (ref <1.00)

## 2020-07-09 LAB — TESTOSTERONE TOTAL,FREE,BIO, MALES
Albumin: 4.6 g/dL (ref 3.6–5.1)
Sex Hormone Binding: 52 nmol/L — ABNORMAL HIGH (ref 10–50)
Testosterone, Bioavailable: 107.4 ng/dL — ABNORMAL LOW (ref >=110.0)
Testosterone, Free: 51.1 pg/mL (ref 46.0–224.0)
Testosterone: 559 ng/dL (ref 250–827)

## 2020-07-09 LAB — HIV ANTIBODY (ROUTINE TESTING W REFLEX): HIV 1&2 Ab, 4th Generation: NONREACTIVE

## 2020-07-13 ENCOUNTER — Encounter: Payer: Self-pay | Admitting: Internal Medicine

## 2020-07-14 ENCOUNTER — Encounter: Payer: Self-pay | Admitting: Internal Medicine

## 2020-07-14 ENCOUNTER — Other Ambulatory Visit: Payer: Self-pay | Admitting: *Deleted

## 2020-07-14 DIAGNOSIS — F17210 Nicotine dependence, cigarettes, uncomplicated: Secondary | ICD-10-CM

## 2020-07-15 ENCOUNTER — Telehealth: Payer: Self-pay | Admitting: Internal Medicine

## 2020-07-15 NOTE — Telephone Encounter (Signed)
Please advise 

## 2020-07-15 NOTE — Telephone Encounter (Signed)
Called pt, LVM.   

## 2020-07-15 NOTE — Telephone Encounter (Signed)
Patient is requesting a call back in regards to recent lab work. He can be reached at [phone]

## 2020-07-15 NOTE — Telephone Encounter (Signed)
A letter was sent His Lithium level was low but the other labs were all okay

## 2020-07-20 ENCOUNTER — Telehealth: Payer: Self-pay | Admitting: Gastroenterology

## 2020-07-20 NOTE — Telephone Encounter (Signed)
While this patient is due for a screening colonoscopy because of his family history of colon cancer as noted, he needs an office visit with an APP because he is on Plavix for history of coronary artery disease.  - HD

## 2020-07-20 NOTE — Telephone Encounter (Signed)
Hi Dr. Loletha Carrow,  We received a referral for repeat colonoscopy for this patient. He had one done in 2016 available in Epic through [facility]. States recall would be in 10 yrs however he has family HX of colon cancer (Father) and states PCP is recommending recall in 3-5 years.   Please review records also shows patient had EGD as well.   Advise on scheduling.  Thank you.

## 2020-07-22 ENCOUNTER — Encounter: Payer: Self-pay | Admitting: Nurse Practitioner

## 2020-07-22 NOTE — Telephone Encounter (Signed)
Patient has been scheduled for an OV with APP on 08/11/20.

## 2020-07-22 NOTE — Progress Notes (Signed)
Cardiology Office Note   Date:  07/23/2020   ID:  Louis Cardenas, DOB 1966-01-23, MRN [id number]  PCP:  Janith Lima, MD  Cardiologist:   No primary care provider on file. Referring:  Janith Lima, MD  Chief Complaint  Patient presents with  . Coronary Artery Disease      History of Present Illness: Louis Cardenas is a 55 y.o. male who was referred by Janith Lima, MD for evaluation of CAD.  He had a myocardial infarction 2011 in [place].  I do not have these records.  This was precipitated by stress at work.  He reports stenting but I do not know the vessel although it sounds like an LAD.  He has been treated as he is moved for jobs and has had a cardiologist over the years he is done yearly treadmills.  The last one was greater than a year ago.  He is not had any other abnormalities since then.  He is living here now in reestablishing with a cardiologist. The patient denies any new symptoms such as chest discomfort, neck or arm discomfort. There has been no new shortness of breath, PND or orthopnea. There have been no reported palpitations, presyncope or syncope.  He has none of the chest pain it was his heart attack.  He unfortunately continues to smoke a few cigarettes.  Past Medical History:  Diagnosis Date  . Bipolar 1 disorder ([place])   . CAD (coronary artery disease)   . Depressed   . Eating disorder   . Hypogonadism in male   . Thyroid nodule     Past Surgical History:  Procedure Laterality Date  . APPENDECTOMY  1998     Current Outpatient Medications  Medication Sig Dispense Refill  . aspirin 81 MG chewable tablet Chew by mouth daily.    Marland Kitchen atorvastatin (LIPITOR) 40 MG tablet Take 40 mg by mouth daily.    . clonazePAM (KLONOPIN) 0.5 MG tablet Take 0.5 mg by mouth as needed for anxiety.    . clopidogrel (PLAVIX) 75 MG tablet Take 75 mg by mouth daily.    . cyclobenzaprine (FLEXERIL) 10 MG tablet Take 1 tablet (10 mg total) by mouth 3 (three)  times daily as needed for muscle spasms. This medicine may make you drowsy. Do not take it with clonazepam at the same time. 20 tablet 0  . diclofenac Sodium (VOLTAREN) 1 % GEL Apply 4 g topically 4 (four) times daily. 100 g 0  . lamoTRIgine (LAMICTAL) 200 MG tablet Take 200 mg by mouth daily.    Marland Kitchen lithium 300 MG tablet Take 300 mg by mouth daily.     . nitroGLYCERIN (NITROSTAT) 0.4 MG SL tablet Place 0.4 mg under the tongue every 5 (five) minutes as needed for chest pain.    . pantoprazole (PROTONIX) 40 MG tablet Take 40 mg by mouth daily.    . sildenafil (VIAGRA) 100 MG tablet Take 100 mg by mouth daily as needed for erectile dysfunction.    . Testosterone 40.5 MG/2.5GM (1.62%) GEL Place onto the skin daily. Apply 1 packet daily    . varenicline (CHANTIX CONTINUING MONTH PAK) 1 MG tablet Take 1 tablet (1 mg total) by mouth 2 (two) times daily. 60 tablet 2   No current facility-administered medications for this visit.    Allergies:   Patient has no known allergies.    Social History:  The patient  reports that he has been smoking cigarettes. He has a  25.00 pack-year smoking history. He has never used smokeless tobacco. He reports current alcohol use of about 2.0 standard drinks of alcohol per week. He reports previous drug use.   Family History:  The patient's family history includes Colon cancer in his father; Depression in his mother; Hypertension in his mother; Thyroid disease in his mother.    ROS:  Please see the history of present illness.   Otherwise, review of systems are positive for none.   All other systems are reviewed and negative.    PHYSICAL EXAM: VS:  BP 102/60   Pulse (!) 58   Ht 5\' 6"  (1.676 m)   Wt 139 lb 12.8 oz (63.4 kg)   BMI 22.56 kg/m  , BMI Body mass index is 22.56 kg/m. GENERAL:  Well appearing HEENT:  Pupils equal round and reactive, fundi not visualized, oral mucosa unremarkable NECK:  No jugular venous distention, waveform within normal limits, carotid  upstroke brisk and symmetric, no bruits, no thyromegaly LYMPHATICS:  No cervical, inguinal adenopathy LUNGS:  Clear to auscultation bilaterally BACK:  No CVA tenderness CHEST:  Unremarkable HEART:  PMI not displaced or sustained,S1 and S2 within normal limits, no S3, no S4, no clicks, no rubs, no murmurs ABD:  Flat, positive bowel sounds normal in frequency in pitch, no bruits, no rebound, no guarding, no midline pulsatile mass, no hepatomegaly, no splenomegaly EXT:  2 plus pulses throughout, no edema, no cyanosis no clubbing SKIN:  No rashes no nodules NEURO:  Cranial nerves II through XII grossly intact, motor grossly intact throughout PSYCH:  Cognitively intact, oriented to person place and time    EKG:  EKG is ordered today. The ekg ordered today demonstrates NSR rate 58, axis within normal limits, intervals within normal limits, no acute ST-T wave changes.   Recent Labs: 07/08/2020: ALT 17; BUN 15; Creatinine, Ser 1.24; Hemoglobin 14.5; Platelets 309.0; Potassium 4.5; Sodium 141; TSH 0.63    Lipid Panel    Component Value Date/Time   CHOL 118 07/08/2020 1154   TRIG 42.0 07/08/2020 1154   HDL 50.10 07/08/2020 1154   CHOLHDL 2 07/08/2020 1154   VLDL 8.4 07/08/2020 1154   LDLCALC 60 07/08/2020 1154      Wt Readings from Last 3 Encounters:  07/23/20 139 lb 12.8 oz (63.4 kg)  07/08/20 142 lb (64.4 kg)  01/22/20 130 lb 14.4 oz (59.4 kg)      Other studies Reviewed: Additional studies/ records that were reviewed today include: Labs. Review of the above records demonstrates:  Please see elsewhere in the note.     ASSESSMENT AND PLAN:  CAD: He is going to get me a release of information.  We talked about secondary risk reduction.  I will bring the patient back for a POET (Plain Old Exercise Test). This will allow me to screen for obstructive coronary disease, risk stratify and very importantly provide a prescription for exercise.  I agree with dual antiplatelet therapy  especially until I get to review his records and we will be asking for these to be sent.  DYSLIPIDEMIA: LDL was 60, HDL 50.  No change in therapy.  We talked at length about a plant-based Mediterranean diet.  TOBACCO ABUSE: I am going to prescribe Chantix because I want him to be smoking cigarettes not at all.  Any risk associated with Chantix or patches is outweighed by the risk of smoking cigarettes.  We had this conversation.   Current medicines are reviewed at length with the patient today.  The patient does not have concerns regarding medicines.  The following changes have been made: As above  Labs/ tests ordered today include:   Orders Placed This Encounter  Procedures  . EXERCISE TOLERANCE TEST (ETT)  . EKG 12-Lead     Disposition:   FU with me in six months.     Signed, Minus Breeding, MD  07/23/2020 12:02 PM    [facility]

## 2020-07-23 ENCOUNTER — Encounter: Payer: Self-pay | Admitting: Cardiology

## 2020-07-23 ENCOUNTER — Ambulatory Visit (INDEPENDENT_AMBULATORY_CARE_PROVIDER_SITE_OTHER): Payer: BC Managed Care – PPO | Admitting: Cardiology

## 2020-07-23 ENCOUNTER — Other Ambulatory Visit: Payer: Self-pay

## 2020-07-23 VITALS — BP 102/60 | HR 58 | Ht 66.0 in | Wt 139.8 lb

## 2020-07-23 DIAGNOSIS — I2510 Atherosclerotic heart disease of native coronary artery without angina pectoris: Secondary | ICD-10-CM | POA: Diagnosis not present

## 2020-07-23 DIAGNOSIS — E785 Hyperlipidemia, unspecified: Secondary | ICD-10-CM

## 2020-07-23 MED ORDER — VARENICLINE TARTRATE 1 MG PO TABS: 1.0000 mg | ORAL_TABLET | Freq: Two times a day (BID) | ORAL | 2 refills | Status: DC

## 2020-07-23 NOTE — Patient Instructions (Addendum)
Medication Instructions:  Continue current medications  *If you need a refill on your cardiac medications before your next appointment, please call your pharmacy*   Lab Work: None Ordered   Testing/Procedures: Your physician has requested that you have an exercise tolerance test. For further information please visit [email or web address]. Please also follow instruction sheet, as given.   Follow-Up: At [facility], you and your health needs are our priority.  As part of our continuing mission to provide you with exceptional heart care, we have created designated Provider Care Teams.  These Care Teams include your primary Cardiologist (physician) and Advanced Practice Providers (APPs -  Physician Assistants and Nurse Practitioners) who all work together to provide you with the care you need, when you need it.  We recommend signing up for the patient portal called "MyChart".  Sign up information is provided on this After Visit Summary.  MyChart is used to connect with patients for Virtual Visits (Telemedicine).  Patients are able to view lab/test results, encounter notes, upcoming appointments, etc.  Non-urgent messages can be sent to your provider as well.   To learn more about what you can do with MyChart, go to [email or web address].    Your next appointment:   6 month(s)  The format for your next appointment:   In Person  Provider:   You may see Minus Breeding, MD or one of the following Advanced Practice Providers on your designated Care Team:    Rosaria Ferries, PA-C  Jory Sims, DNP, ANP

## 2020-07-30 ENCOUNTER — Ambulatory Visit: Payer: Managed Care, Other (non HMO) | Admitting: Endocrinology

## 2020-08-03 ENCOUNTER — Ambulatory Visit
Admission: RE | Admit: 2020-08-03 | Discharge: 2020-08-03 | Disposition: A | Discharge status: Home / Self Care | Payer: BC Managed Care – PPO | Source: Ambulatory Visit | Attending: Acute Care | Admitting: Acute Care

## 2020-08-03 ENCOUNTER — Encounter: Payer: Self-pay | Admitting: Acute Care

## 2020-08-03 ENCOUNTER — Ambulatory Visit (INDEPENDENT_AMBULATORY_CARE_PROVIDER_SITE_OTHER): Payer: BC Managed Care – PPO | Admitting: Acute Care

## 2020-08-03 ENCOUNTER — Other Ambulatory Visit: Payer: Self-pay

## 2020-08-03 VITALS — BP 112/62 | HR 72 | Temp 98.0°F | Ht 66.0 in | Wt 136.0 lb

## 2020-08-03 DIAGNOSIS — F17210 Nicotine dependence, cigarettes, uncomplicated: Secondary | ICD-10-CM

## 2020-08-03 DIAGNOSIS — Z122 Encounter for screening for malignant neoplasm of respiratory organs: Secondary | ICD-10-CM

## 2020-08-03 NOTE — Patient Instructions (Signed)
Thank you for participating in [facility]. It was our pleasure to meet you today. We will call you with the results of your scan within the next few days. Your scan will be assigned a Lung RADS category score by the physicians reading the scans.  This Lung RADS score determines follow up scanning.  See below for description of categories, and follow up screening recommendations. We will be in touch to schedule your follow up screening annually or based on recommendations of our providers. We will fax a copy of your scan results to your Primary Care Physician, or the physician who referred you to the program, to ensure they have the results. Please call the office if you have any questions or concerns regarding your scanning experience or results.  Our office number is [phone]. Please speak with Denise Phelps, RN. She is our Lung Cancer Screening RN. If she is unavailable when you call, please have the office staff send her a message. She will return your call at her earliest convenience. Remember, if your scan is normal, we will scan you annually as long as you continue to meet the criteria for the program. (Age 55-77, Current smoker or smoker who has quit within the last 15 years). If you are a smoker, remember, quitting is the single most powerful action that you can take to decrease your risk of lung cancer and other pulmonary, breathing related problems. We know quitting is hard, and we are here to help.  Please let us know if there is anything we can do to help you meet your goal of quitting. If you are a former smoker, congratulations. We are proud of you! Remain smoke free! Remember you can refer friends or family members through the number above.  We will screen them to make sure they meet criteria for the program. Thank you for helping us take better care of you by participating in Lung Screening.  Lung RADS Categories:  Lung RADS 1: no nodules  or definitely non-concerning nodules.  Recommendation is for a repeat annual scan in 12 months.  Lung RADS 2:  nodules that are non-concerning in appearance and behavior with a very low likelihood of becoming an active cancer. Recommendation is for a repeat annual scan in 12 months.  Lung RADS 3: nodules that are probably non-concerning , includes nodules with a low likelihood of becoming an active cancer.  Recommendation is for a 6-month repeat screening scan. Often noted after an upper respiratory illness. We will be in touch to make sure you have no questions, and to schedule your 6-month scan.  Lung RADS 4 A: nodules with concerning findings, recommendation is most often for a follow up scan in 3 months or additional testing based on our provider's assessment of the scan. We will be in touch to make sure you have no questions and to schedule the recommended 3 month follow up scan.  Lung RADS 4 B:  indicates findings that are concerning. We will be in touch with you to schedule additional diagnostic testing based on our provider's  assessment of the scan.   

## 2020-08-03 NOTE — Progress Notes (Signed)
Shared Decision Making Visit Lung Cancer Screening Program [phone])   Eligibility:  Age 55 y.o.  Pack Years Smoking History Calculation 30 pack years (# packs/per year x # years smoked)  Recent History of coughing up blood  no  Unexplained weight loss? no ( >Than 15 pounds within the last 6 months )  Prior History Lung / other cancer no (Diagnosis within the last 5 years already requiring surveillance chest CT Scans).  Smoking Status Current Smoker  Former Smokers: Years since quit: NA  Quit Date: NA  Visit Components:  Discussion included one or more decision making aids. yes  Discussion included risk/benefits of screening. yes  Discussion included potential follow up diagnostic testing for abnormal scans. yes  Discussion included meaning and risk of over diagnosis. yes  Discussion included meaning and risk of False Positives. yes  Discussion included meaning of total radiation exposure. yes  Counseling Included:  Importance of adherence to annual lung cancer LDCT screening. yes  Impact of comorbidities on ability to participate in the program. yes  Ability and willingness to under diagnostic treatment. yes  Smoking Cessation Counseling:  Current Smokers:   Discussed importance of smoking cessation. yes  Information about tobacco cessation classes and interventions provided to patient. yes  Patient provided with "ticket" for LDCT Scan. yes  Symptomatic Patient. no  CounselingNA  Diagnosis Code: Tobacco Use Z72.0  Asymptomatic Patient yes  Counseling (Intermediate counseling: > three minutes counseling) F7902  Former Smokers:   Discussed the importance of maintaining cigarette abstinence. yes  Diagnosis Code: Personal History of Nicotine Dependence. I09.735  Information about tobacco cessation classes and interventions provided to patient. Yes  Patient provided with "ticket" for LDCT Scan. yes  Written Order for Lung Cancer Screening with LDCT  placed in Epic. Yes (CT Chest Lung Cancer Screening Low Dose W/O CM) [id number] Z12.2-Screening of respiratory organs Z87.891-Personal history of nicotine dependence  I have spent 25 minutes of face to face time with Mr. Chopin discussing the risks and benefits of lung cancer screening. We viewed a power point together that explained in detail the above noted topics. We paused at intervals to allow for questions to be asked and answered to ensure understanding.We discussed that the single most powerful action that he can take to decrease his risk of developing lung cancer is to quit smoking. We discussed whether or not he is ready to commit to setting a quit date. We discussed options for tools to aid in quitting smoking including nicotine replacement therapy, non-nicotine medications, support groups, Quit Smart classes, and behavior modification. We discussed that often times setting smaller, more achievable goals, such as eliminating 1 cigarette a day for a week and then 2 cigarettes a day for a week can be helpful in slowly decreasing the number of cigarettes smoked. This allows for a sense of accomplishment as well as providing a clinical benefit. I gave him the " Be Stronger Than Your Excuses" card with contact information for community resources, classes, free nicotine replacement therapy, and access to mobile apps, text messaging, and on-line smoking cessation help. I have also given him my card and contact information in the event he needs to contact me. We discussed the time and location of the scan, and that either Doroteo Glassman RN or I will call with the results within 24-48 hours of receiving them. I have offered him  a copy of the power point we viewed  as a resource in the event they need reinforcement of the concepts  we discussed today in the office. The patient verbalized understanding of all of  the above and had no further questions upon leaving the office. They have my contact information in  the event they have any further questions.  I spent 3 minutes counseling on smoking cessation and the health risks of continued tobacco abuse.  I explained to the patient that there has been a high incidence of coronary artery disease noted on these exams. I explained that this is a non-gated exam therefore degree or severity cannot be determined. This patient is currently on statin therapy. I have asked the patient to follow-up with their PCP regarding any incidental finding of coronary artery disease and management with diet or medication as their PCP  feels is clinically indicated. The patient verbalized understanding of the above and had no further questions upon completion of the visit.      Magdalen Spatz, NP 08/03/2020 10:34 AM

## 2020-08-05 ENCOUNTER — Encounter: Payer: Self-pay | Admitting: Internal Medicine

## 2020-08-06 ENCOUNTER — Other Ambulatory Visit: Payer: Self-pay | Admitting: *Deleted

## 2020-08-06 DIAGNOSIS — F17210 Nicotine dependence, cigarettes, uncomplicated: Secondary | ICD-10-CM

## 2020-08-06 NOTE — Progress Notes (Signed)
Please call patient and let them  know their  low dose Ct was read as a Lung RADS 1, negative study: no nodules or definitely benign nodules. Radiology recommendation is for a repeat LDCT in 12 months. .Please let them  know we will order and schedule their  annual screening scan for 07/2021. Please let them  know there was notation of CAD on their  scan.  Please remind the patient  that this is a non-gated exam therefore degree or severity of disease  cannot be determined. Please have them  follow up with their PCP regarding potential risk factor modification, dietary therapy or pharmacologic therapy if clinically indicated. Pt.  is  currently on statin therapy. Please place order for annual  screening scan for  07/2021 and fax results to PCP. Thanks so much.

## 2020-08-07 ENCOUNTER — Telehealth (HOSPITAL_COMMUNITY): Payer: Self-pay | Admitting: *Deleted

## 2020-08-07 NOTE — Telephone Encounter (Signed)
Close encounter 

## 2020-08-10 ENCOUNTER — Other Ambulatory Visit (HOSPITAL_COMMUNITY)
Admission: RE | Admit: 2020-08-10 | Discharge: 2020-08-10 | Disposition: A | Discharge status: Home / Self Care | Payer: BC Managed Care – PPO | Source: Ambulatory Visit | Attending: Cardiovascular Disease | Admitting: Cardiovascular Disease

## 2020-08-10 DIAGNOSIS — Z20822 Contact with and (suspected) exposure to covid-19: Secondary | ICD-10-CM | POA: Insufficient documentation

## 2020-08-10 DIAGNOSIS — Z01812 Encounter for preprocedural laboratory examination: Secondary | ICD-10-CM | POA: Insufficient documentation

## 2020-08-10 LAB — SARS CORONAVIRUS 2 (TAT 6-24 HRS): SARS Coronavirus 2: NEGATIVE

## 2020-08-11 ENCOUNTER — Encounter: Payer: Self-pay | Admitting: Nurse Practitioner

## 2020-08-11 ENCOUNTER — Ambulatory Visit (INDEPENDENT_AMBULATORY_CARE_PROVIDER_SITE_OTHER): Payer: BC Managed Care – PPO | Admitting: Nurse Practitioner

## 2020-08-11 ENCOUNTER — Other Ambulatory Visit: Payer: Self-pay

## 2020-08-11 VITALS — BP 116/70 | HR 66 | Ht 66.0 in | Wt 144.8 lb

## 2020-08-11 DIAGNOSIS — K5900 Constipation, unspecified: Secondary | ICD-10-CM

## 2020-08-11 DIAGNOSIS — Z800 Family history of malignant neoplasm of digestive organs: Secondary | ICD-10-CM | POA: Diagnosis not present

## 2020-08-11 DIAGNOSIS — K635 Polyp of colon: Secondary | ICD-10-CM

## 2020-08-11 DIAGNOSIS — K219 Gastro-esophageal reflux disease without esophagitis: Secondary | ICD-10-CM

## 2020-08-11 DIAGNOSIS — K625 Hemorrhage of anus and rectum: Secondary | ICD-10-CM

## 2020-08-11 NOTE — Progress Notes (Signed)
Reviewed.  Burt Piatek L. Meloni Hinz, MD, MPH  

## 2020-08-11 NOTE — Patient Instructions (Addendum)
If you are age 53 or younger, your body mass index should be between 19-25. Your Body mass index is 23.37 kg/m. If this is out of the aformentioned range listed, please consider follow up with your Primary Care Provider.   PROCEDURES: You have been scheduled for an endoscopy and colonoscopy. Please follow the written instructions given to you at your visit today. If you use inhalers (even only as needed), please bring them with you on the day of your procedure.  You will be contacted by our office prior to your procedure for directions on holding your Plavix.  If you do not hear from our office 1 week prior to your scheduled procedure, please call [phone] to discuss.   RECOMMENDATIONS: Miralax- Dissolve one capful in 8 ounces of water and drink before bed. Please continue taking your Pantoprazole 40 MG daily.  It was great seeing you today! Thank you for entrusting me with your care and choosing [facility].  Noralyn Pick, CRNP

## 2020-08-11 NOTE — Progress Notes (Signed)
08/11/2020 Louis Cardenas [id number] June 10, 1965   CHIEF COMPLAINT: Schedule a colonoscopy   HISTORY OF PRESENT ILLNESS:  Louis Cardenas is a 55 year old male with a past medical history of anxiety, depression, bipolar disorder, sleep apnea does not use cpap, coronary artery disease s/p MI in 2011 s/p stent on ASA and Plavix, kidney stones and colon polyps. S/P appendectomy 1998. He was referred to our office by Dr. Scarlette Calico to schedule a colonoscopy. He underwent a colonoscopy 07/11/2014 by Dr. Wayland Salinas in [place], 5 tubular adenomatous polyps were removed from the colon. A repeat colonoscopy in 5 years was recommended. Father with history of colon cancer. He discussed having a history of bulimia. He self induces vomiting once every 2 to 3 weeks. He takes Dulcolax to clean out his bowels every few weeks. He sometimes "spits up" small amounts of red blood. He denies coughing up blood or vomiting blood. He has some heartburn, coffee results in esophageal burning. No dysphagia. No melena.  He is on Pantoprazole 40mg  once daily. He underwent an EGD at the time of his colonoscopy in 2016 which showed gastritis, no evidence of H. Pylori. He denies having any upper or lower abdominal pan. He typically passes a normal brown formed stool most days. He has intermittent bright red rectal bleeding. History of CAD s/p MI with stent placement in 2011. He remains on ASA 81mg  daily and Plavix. He is followed by cardiologist. He is scheduled for a routine cardiac stress test tomorrow.  He continues to smoke cigarettes. He was recently prescribed Chantix but he has not yet initiated it. Chest Ct 08/03/2020 showed mild emphysematous changes, no pulmonary mass or suspicious lesions.    CBC Latest Ref Rng & Units 07/08/2020 02/21/2019  WBC 4.0 - 10.5 K/uL 7.5 8.5  Hemoglobin 13.0 - 17.0 g/dL 14.5 14.0  Hematocrit 39.0 - 52.0 % 43.9 44.3  Platelets 150.0 - 400.0 K/uL 309.0 327   CMP Latest Ref Rng & Units 07/08/2020  06/05/2019 02/21/2019  Glucose 70 - 99 mg/dL 97 - 85  BUN 6 - 23 mg/dL 15 16 11   Creatinine 0.40 - 1.50 mg/dL 1.24 1.4(A) 1.29(H)  Sodium 135 - 145 mEq/L 141 - 141  Potassium 3.5 - 5.1 mEq/L 4.5 - 4.1  Chloride 96 - 112 mEq/L 109 - 109  CO2 19 - 32 mEq/L 28 - 24  Calcium 8.4 - 10.5 mg/dL 10.0 - 9.6  Total Protein 6.0 - 8.3 g/dL 6.9 - 6.1(L)  Total Bilirubin 0.2 - 1.2 mg/dL 0.5 - 0.6  Alkaline Phos 39 - 117 U/L 95 - 66  AST 0 - 37 U/L 16 - 18  ALT 0 - 53 U/L 17 - 18   Colonoscopy 07/11/2014 by Dr. Donalda Ewings:  1. Sessile polyp measuring 6 mm in size in the ascending colon;  polypectomy was performed with a cold snare , A single hemoclip was placed to  avoid bleeding.  2. Sessile polyp measuring 8 mm in size in the ascending colon; polypectomy was  performed with a cold snare , A single hemoclip was placed to avoid bleeding.  3. Sessile polyp, 33mm, in the transverse colon; polypectomy was performed with  a cold snare  4. 2 Sessile polyps measuring 2 and 3 mm in size in the descending colon  removed by cold forceps  5. Mild diverticulosis was noted in the sigmoid colon  6. Small internal hemorrhoids, as seen on reflexion C) ASCENDING COLON POLYP:  - TUBULAR ADENOMA.  D) ASCENDING COLON POLYP:  - TUBULAR ADENOMA.   E) TRANSVERSE COLON POLYP:  - TUBULAR ADENOMA.   F) DESCENDING COLON POLYP:  - TUBULAR ADENOMA.   EGD 07/11/2014: 1. The examined duodenum was normal  2. There was mild gastritis in the antrum and gastric body; multiple biopsies  were performed  3. Normal esophagus observed  A) GASTRIC ANTRUM BIOPSY:  - ANTRAL MUCOSA, WITHIN NORMAL LIMITS.  - NO HELICOBACTER PYLORI SEEN ON H&E STAIN.   B) GASTRIC BODY BIOPSY:  - CORPUS MUCOSA, WITHIN NORMAL LIMITS.  - NO HELICOBACTER PYLORI SEEN ON H&E STAIN.   Past Medical History:  Diagnosis Date  . Bipolar 1 disorder ([place])   . CAD (coronary artery disease)   . Depressed   . Eating disorder   .  Hypogonadism in male   . Thyroid nodule    Past Surgical History:  Procedure Laterality Date  . APPENDECTOMY  1998   Social History: He is married. He has one one and 2 daughters. He is a Interior and spatial designer. He smokes cigarettes 3/4 ppd x 35 years. He drinks 2 to 3 glasses of wine three times yearly. Remote history of marijuana use.   Family History: Father diagnosed with colon cancer with metastasis to liver age 57. Mother age 22 with history of hypertension, thyroid disease and depression.   No Known Allergies  Outpatient Encounter Medications as of 08/11/2020  Medication Sig  . aspirin 81 MG chewable tablet Chew by mouth daily.  Marland Kitchen atorvastatin (LIPITOR) 40 MG tablet Take 40 mg by mouth daily.  . clonazePAM (KLONOPIN) 0.5 MG tablet Take 0.5 mg by mouth as needed for anxiety.  . clopidogrel (PLAVIX) 75 MG tablet Take 75 mg by mouth daily.  . cyclobenzaprine (FLEXERIL) 10 MG tablet Take 1 tablet (10 mg total) by mouth 3 (three) times daily as needed for muscle spasms. This medicine may make you drowsy. Do not take it with clonazepam at the same time.  . diclofenac Sodium (VOLTAREN) 1 % GEL Apply 4 g topically 4 (four) times daily.  Marland Kitchen lamoTRIgine (LAMICTAL) 200 MG tablet Take 200 mg by mouth daily.  Marland Kitchen lithium 300 MG tablet Take 300 mg by mouth daily.   . nitroGLYCERIN (NITROSTAT) 0.4 MG SL tablet Place 0.4 mg under the tongue every 5 (five) minutes as needed for chest pain.  . pantoprazole (PROTONIX) 40 MG tablet Take 40 mg by mouth daily.  . sildenafil (VIAGRA) 100 MG tablet Take 100 mg by mouth daily as needed for erectile dysfunction.  . Testosterone 40.5 MG/2.5GM (1.62%) GEL Place onto the skin daily. Apply 1 packet daily  . varenicline (CHANTIX CONTINUING MONTH PAK) 1 MG tablet Take 1 tablet (1 mg total) by mouth 2 (two) times daily.   No facility-administered encounter medications on file as of 08/11/2020.    REVIEW OF SYSTEMS:  Gen: Denies fever, sweats or chills. No  weight loss.  CV: See HPI.  Resp: Denies cough, shortness of breath of hemoptysis.  GI: See HPI.  GU : Denies urinary burning, blood in urine, increased urinary frequency or incontinence. MS: Denies joint pain, muscles aches or weakness. Derm: Denies rash, itchiness, skin lesions or unhealing ulcers. Psych: See HPI.  Heme: Denies bruising, bleeding. Neuro:  Denies headaches, dizziness or paresthesias. Endo:  Denies any problems with DM, thyroid or adrenal function.  PHYSICAL EXAM: BP 116/70   Pulse 66   Ht 5\' 6"  (1.676 m)   Wt 144 lb 12.8 oz (65.7 kg)  SpO2 100%   BMI 23.37 kg/m   General: 55 year old male in no acute distress. Head: Normocephalic and atraumatic. Eyes:  Sclerae non-icteric, conjunctive pink. Ears: Normal auditory acuity. Mouth: Dentition intact. No ulcers or lesions.  Neck: Supple, no lymphadenopathy or thyromegaly.  Lungs: Clear bilaterally to auscultation without wheezes, crackles or rhonchi. Heart: Regular rate and rhythm. No murmur, rub or gallop appreciated.  Abdomen: Soft, nontender, non distended. No masses. No hepatosplenomegaly. Normoactive bowel sounds x 4 quadrants.  Rectal: Deferred.  Musculoskeletal: Symmetrical with no gross deformities. Skin: Warm and dry. No rash or lesions on visible extremities. Extremities: No edema. Neurological: Alert oriented x 4, no focal deficits.  Psychological:  Alert and cooperative. Normal mood and affect.  ASSESSMENT AND PLAN:  24. 55 year old male with a history of tubular adenomatous colon polyps per colonoscopy in 2016. First degree relative (father) with history of colon cancer. Rectal bleeding.  -Colonoscopy benefits and risks discussed including risk with sedation, risk of bleeding, perforation and infection  -Our office will contact cardiologist Dr. Percival Spanish to verify Plavix instructions prior to colonoscopy  -Miralax as needed to avoid constipation/straining  2. GERD. History of bulimia, induces  vomiting once every 2 to 3 weeks. Patient reports he intermittently "spits up blood". On Protonix 40mg  QD.  -EGD benefits and risks discussed including risk with sedation, risk of bleeding, perforation and infection  -Our office will contact cardiologist Dr. Percival Spanish to verify Plavix instructions prior to colonoscopy  -Continue Pantoprazole 40mg  QD -Smoking cessation encouraged -Defer bulimia management to Dr. Ronnald Ramp   3.  Coronary artery disease s/p MI and stent x 1 on ASA and Plavix. Patient is scheduled for a routine cardiac stress test 08/12/2020. Patient is aware if his stress test is abnormal, EGD and colonoscopy to be deferred until cleared by cardiology.   Further recommendation to be determined after EGD and colonoscopy completed             CC:  Janith Lima, MD

## 2020-08-12 ENCOUNTER — Other Ambulatory Visit: Payer: Self-pay

## 2020-08-12 ENCOUNTER — Telehealth: Payer: Self-pay

## 2020-08-12 ENCOUNTER — Ambulatory Visit (HOSPITAL_COMMUNITY)
Admission: RE | Admit: 2020-08-12 | Discharge: 2020-08-12 | Disposition: A | Discharge status: Home / Self Care | Payer: BC Managed Care – PPO | Source: Ambulatory Visit | Attending: Cardiovascular Disease | Admitting: Cardiovascular Disease

## 2020-08-12 DIAGNOSIS — I2510 Atherosclerotic heart disease of native coronary artery without angina pectoris: Secondary | ICD-10-CM | POA: Diagnosis present

## 2020-08-12 LAB — EXERCISE TOLERANCE TEST
Estimated workload: 12.6 METS
Exercise duration (min): 10 min
Exercise duration (sec): 32 s
MPHR: 168 {beats}/min
Peak HR: 151 {beats}/min
Percent HR: 90 %
Rest HR: 57 {beats}/min

## 2020-08-12 NOTE — Telephone Encounter (Signed)
Request for surgical clearance:     Endoscopy Procedure  What type of surgery is being performed?     colonoscopy  When is this surgery scheduled?     10/16/20  What type of clearance is required ?   Pharmacy  Are there any medications that need to be held prior to surgery and how long? Plavix 5 day hold  Practice name and name of physician performing surgery?      [facility]  What is your office phone and fax number?      Phone- [phone]  [phone]  Anesthesia type (None, local, MAC, general) ?       MAC

## 2020-08-12 NOTE — Telephone Encounter (Signed)
Patient is pending exercise tolerance test today, currently awaiting result.  Dr. Percival Spanish, would you be okay with the patient coming off of Plavix for 5 days if plain old treadmill test come back negative for ischemia?  Please forward your response to P CV DIV PREOP

## 2020-08-13 NOTE — Telephone Encounter (Signed)
Please inform the patient to hold plavix for 5 days prior to the procedure. I attempted to contact the patient, he did not pick up the phone, voicemail was not set up.

## 2020-08-13 NOTE — Telephone Encounter (Signed)
S/w the pt and he has been made aware he is cleared for his upcoming procedure. Pt aware that he will need to hold his Plavix x 5 days prior to his procedure. I will fax clearance notes to requesting office. Pt thanked me for the call and the help.

## 2020-08-13 NOTE — Telephone Encounter (Signed)
I am seeing that his POET (Plain Old Exercise Treadmill) was negative.  OK for planned procedure.  OK to stop Plavix as needed for the procedure and continue ASA.  I am still waiting for records from his outside cardiologist to see if he needs continued DAPT and I would suggest go back on Plavix until I can get these data.

## 2020-08-13 NOTE — Telephone Encounter (Signed)
    Louis Cardenas DOB:  08-28-1965  MRN:  [id number]   Primary Cardiologist: No primary care provider on file.  Chart reviewed as part of pre-operative protocol coverage. Given past medical history and time since last visit, based on ACC/AHA guidelines, Louis Cardenas would be at acceptable risk for the planned procedure without further cardiovascular testing.   The patient was advised that if he develops new symptoms prior to surgery to contact our office to arrange for a follow-up visit, and he verbalized understanding.  Patient may hold Plavix for 5 days prior to the procedure and restart as soon as possible afterward at the surgeon's discretion.  I will route this recommendation to the requesting party via Epic fax function and remove from pre-op pool.  Please call with questions.  [place], [place] 08/13/2020, 9:35 AM

## 2020-09-04 ENCOUNTER — Ambulatory Visit: Payer: BC Managed Care – PPO | Admitting: Endocrinology

## 2020-09-22 ENCOUNTER — Encounter (HOSPITAL_COMMUNITY): Payer: Self-pay | Admitting: Emergency Medicine

## 2020-09-22 ENCOUNTER — Emergency Department (HOSPITAL_COMMUNITY)
Admission: EM | Admit: 2020-09-22 | Discharge: 2020-09-22 | Disposition: A | Discharge status: Home / Self Care | Payer: BC Managed Care – PPO | Attending: Emergency Medicine | Admitting: Emergency Medicine

## 2020-09-22 ENCOUNTER — Emergency Department (HOSPITAL_COMMUNITY): Payer: BC Managed Care – PPO

## 2020-09-22 ENCOUNTER — Other Ambulatory Visit: Payer: Self-pay

## 2020-09-22 DIAGNOSIS — Z7982 Long term (current) use of aspirin: Secondary | ICD-10-CM | POA: Insufficient documentation

## 2020-09-22 DIAGNOSIS — G510 Bell's palsy: Secondary | ICD-10-CM

## 2020-09-22 DIAGNOSIS — R29810 Facial weakness: Secondary | ICD-10-CM | POA: Insufficient documentation

## 2020-09-22 DIAGNOSIS — R531 Weakness: Secondary | ICD-10-CM | POA: Insufficient documentation

## 2020-09-22 DIAGNOSIS — Z7902 Long term (current) use of antithrombotics/antiplatelets: Secondary | ICD-10-CM | POA: Diagnosis not present

## 2020-09-22 DIAGNOSIS — I119 Hypertensive heart disease without heart failure: Secondary | ICD-10-CM | POA: Insufficient documentation

## 2020-09-22 DIAGNOSIS — M791 Myalgia, unspecified site: Secondary | ICD-10-CM | POA: Insufficient documentation

## 2020-09-22 DIAGNOSIS — F17210 Nicotine dependence, cigarettes, uncomplicated: Secondary | ICD-10-CM | POA: Insufficient documentation

## 2020-09-22 DIAGNOSIS — H538 Other visual disturbances: Secondary | ICD-10-CM | POA: Insufficient documentation

## 2020-09-22 DIAGNOSIS — I2510 Atherosclerotic heart disease of native coronary artery without angina pectoris: Secondary | ICD-10-CM | POA: Insufficient documentation

## 2020-09-22 HISTORY — DX: Acute myocardial infarction, unspecified: I21.9

## 2020-09-22 HISTORY — DX: Essential (primary) hypertension: I10

## 2020-09-22 HISTORY — DX: Cerebral infarction, unspecified: I63.9

## 2020-09-22 LAB — COMPREHENSIVE METABOLIC PANEL
ALT: 31 U/L (ref 0–44)
AST: 24 U/L (ref 15–41)
Albumin: 4.3 g/dL (ref 3.5–5.0)
Alkaline Phosphatase: 72 U/L (ref 38–126)
Anion gap: 7 (ref 5–15)
BUN: 15 mg/dL (ref 6–20)
CO2: 24 mmol/L (ref 22–32)
Calcium: 9.7 mg/dL (ref 8.9–10.3)
Chloride: 106 mmol/L (ref 98–111)
Creatinine, Ser: 1.37 mg/dL — ABNORMAL HIGH (ref 0.61–1.24)
GFR, Estimated: >60 mL/min (ref >60)
Glucose, Bld: 91 mg/dL (ref 70–99)
Potassium: 4.6 mmol/L (ref 3.5–5.1)
Sodium: 137 mmol/L (ref 135–145)
Total Bilirubin: 0.5 mg/dL (ref 0.3–1.2)
Total Protein: 6.7 g/dL (ref 6.5–8.1)

## 2020-09-22 LAB — PROTIME-INR
INR: 1 (ref 0.8–1.2)
Prothrombin Time: 12.8 seconds (ref 11.4–15.2)

## 2020-09-22 LAB — CBC
HCT: 44.7 % (ref 39.0–52.0)
Hemoglobin: 14.1 g/dL (ref 13.0–17.0)
MCH: 28.3 pg (ref 26.0–34.0)
MCHC: 31.5 g/dL (ref 30.0–36.0)
MCV: 89.8 fL (ref 80.0–100.0)
Platelets: 286 10*3/uL (ref 150–400)
RBC: 4.98 MIL/uL (ref 4.22–5.81)
RDW: 14.3 % (ref 11.5–15.5)
WBC: 7.9 10*3/uL (ref 4.0–10.5)
nRBC: 0 % (ref 0.0–0.2)

## 2020-09-22 LAB — DIFFERENTIAL
Abs Immature Granulocytes: 0.02 10*3/uL (ref 0.00–0.07)
Basophils Absolute: 0.1 10*3/uL (ref 0.0–0.1)
Basophils Relative: 2 %
Eosinophils Absolute: 0.2 10*3/uL (ref 0.0–0.5)
Eosinophils Relative: 2 %
Immature Granulocytes: 0 %
Lymphocytes Relative: 18 %
Lymphs Abs: 1.5 10*3/uL (ref 0.7–4.0)
Monocytes Absolute: 0.5 10*3/uL (ref 0.1–1.0)
Monocytes Relative: 6 %
Neutro Abs: 5.6 10*3/uL (ref 1.7–7.7)
Neutrophils Relative %: 72 %

## 2020-09-22 LAB — APTT: aPTT: 28 seconds (ref 24–36)

## 2020-09-22 LAB — LITHIUM LEVEL: Lithium Lvl: 0.53 mmol/L — ABNORMAL LOW (ref 0.60–1.20)

## 2020-09-22 MED ORDER — SODIUM CHLORIDE 0.9% FLUSH: 3.0000 mL | Freq: Once | INTRAVENOUS | Status: DC

## 2020-09-22 MED ORDER — LORAZEPAM 2 MG/ML IJ SOLN
1.0000 mg | Freq: Once | INTRAMUSCULAR | Status: AC
  Administered 2020-09-22: 1 mg via INTRAVENOUS
  Filled 2020-09-22: qty 1

## 2020-09-22 MED ORDER — SODIUM CHLORIDE 0.9 % IV BOLUS
1000.0000 mL | Freq: Once | INTRAVENOUS | Status: AC
  Administered 2020-09-22: 1000 mL via INTRAVENOUS

## 2020-09-22 MED ORDER — ARTIFICIAL TEARS OPHTHALMIC OINT: TOPICAL_OINTMENT | OPHTHALMIC | 0 refills | Status: DC | PRN

## 2020-09-22 MED ORDER — PREDNISONE 20 MG PO TABS
40.0000 mg | ORAL_TABLET | Freq: Every day | ORAL | 0 refills | Status: DC
Start: 2020-09-22 — End: 2020-11-30

## 2020-09-22 MED ORDER — LORAZEPAM 2 MG/ML IJ SOLN
2.0000 mg | Freq: Once | INTRAMUSCULAR | Status: AC | PRN
  Administered 2020-09-22: 2 mg via INTRAVENOUS
  Filled 2020-09-22: qty 1

## 2020-09-22 NOTE — ED Notes (Signed)
Pt has not returned to room yet, will check vitals once patient is back in room

## 2020-09-22 NOTE — ED Notes (Signed)
Reviewed discharge instructions with patient and spouse. Follow-up care and medications reviewed. Patient and spouse verbalized understanding. Patient A&Ox4, VSS, and ambulatory with steady gait upon discharge.  °

## 2020-09-22 NOTE — ED Provider Notes (Signed)
  Physical Exam  BP 108/65   Pulse (!) 51   Temp 98.8 F (37.1 C) (Oral)   Resp (!) 22   Ht 5\' 6"  (1.676 m)   Wt 61.2 kg   SpO2 100%   BMI 21.79 kg/m   Physical Exam  ED Course/Procedures     Procedures  MDM  Received patient in signout.  Left-sided facial droop for the last few days.  Also some potential weakness on left side of body.  MRI done and reassuring.  Showed no stroke.  I think likely does have some mild forehead involvement.  We will treat as Bell's palsy.  We will do steroids and symptomatic treatment.  Outpatient follow-up as needed.  Doubt acute stroke.  Discharge home.       Davonna Belling, MD 09/22/20 [phone]

## 2020-09-22 NOTE — ED Provider Notes (Signed)
[place] EMERGENCY DEPARTMENT Provider Note   CSN: [id number] Arrival date & time: 09/22/20  1118     History Chief Complaint  Patient presents with  . Weakness  . Facial Droop    Louis Cardenas is a 55 y.o. male.  HPI 55 year old male presents with left-sided weakness.  He states that on 5/26 and 5/27 he was having a migraine headache, which he describes as a squeezing sensation that was severe.  He was also having body aches.  The symptoms have resolved but now since 5/28 he has been having left facial droop, trouble closing the eye, and left arm and leg weakness.  He has a mild 3 out of 10 headache that is bitemporal.  No fevers during this time.  No cough, vomiting.  No neck stiffness.  No recent tick bites or rash.  He felt the symptoms would get better but he still having trouble with fluid coming out of his mouth when he tries to swallow and the droop.  He states he had a stroke a few years ago that he thinks was on the similar side and similar symptoms.  This has resolved completely prior to a few days ago. Mild blurriness of left eye.  Past Medical History:  Diagnosis Date  . Bipolar 1 disorder ([place])   . CAD (coronary artery disease)   . Depressed   . Eating disorder   . Family history of colon cancer in father   . History of colon polyps   . Hypertension   . Hypogonadism in male   . MI (myocardial infarction) ([place])   . Stroke ([place])   . Thyroid nodule     Patient Active Problem List   Diagnosis Date Noted  . Encounter for general adult medical examination with abnormal findings 07/08/2020  . Hyperlipidemia with target LDL less than 70 07/08/2020  . BPH associated with nocturia 07/08/2020  . Bipolar 1 disorder ([place]) 07/08/2020  . Therapeutic drug monitoring 07/08/2020  . Hypogonadism male 07/08/2020  . Polyp of colon 07/08/2020  . Sialolithiasis 01/22/2020  . Carpal tunnel syndrome of right wrist 10/22/2019  . Coronary artery disease  involving native coronary artery of native heart without angina pectoris 10/03/2019  . Acute pain of right shoulder 10/02/2019  . Thyroid nodule     Past Surgical History:  Procedure Laterality Date  . APPENDECTOMY  1998  . COLONOSCOPY    . CORONARY ANGIOPLASTY WITH STENT PLACEMENT    . ROTATOR CUFF REPAIR Left    with screws  . UPPER GASTROINTESTINAL ENDOSCOPY         Family History  Problem Relation Age of Onset  . Thyroid disease Mother   . Depression Mother   . Hypertension Mother   . Colon cancer Father   . Liver cancer Father     Social History   Tobacco Use  . Smoking status: Current Every Day Smoker    Packs/day: 1.00    Years: 25.00    Pack years: 25.00    Types: Cigarettes  . Smokeless tobacco: Never Used  . Tobacco comment: .5 to 1 packs per day   Substance Use Topics  . Alcohol use: Yes    Alcohol/week: 2.0 standard drinks    Types: 2 Glasses of wine per week    Comment: socially  . Drug use: Not Currently    Home Medications Prior to Admission medications   Medication Sig Start Date End Date Taking? Authorizing Provider  aspirin 81 MG chewable  tablet Chew by mouth daily.    [provider]  atorvastatin (LIPITOR) 40 MG tablet Take 40 mg by mouth daily.    [provider]  clonazePAM (KLONOPIN) 0.5 MG tablet Take 0.5 mg by mouth as needed for anxiety.    [provider]  clopidogrel (PLAVIX) 75 MG tablet Take 75 mg by mouth daily.    [provider]  cyclobenzaprine (FLEXERIL) 10 MG tablet Take 1 tablet (10 mg total) by mouth 3 (three) times daily as needed for muscle spasms. This medicine may make you drowsy. Do not take it with clonazepam at the same time. 10/02/19   Masoudi, Dorthula Rue, MD  diclofenac Sodium (VOLTAREN) 1 % GEL Apply 4 g topically 4 (four) times daily. 10/02/19   Masoudi, Dorthula Rue, MD  lamoTRIgine (LAMICTAL) 200 MG tablet Take 200 mg by mouth daily.    [provider]  lithium 300 MG  tablet Take 300 mg by mouth daily.     [provider]  nitroGLYCERIN (NITROSTAT) 0.4 MG SL tablet Place 0.4 mg under the tongue every 5 (five) minutes as needed for chest pain.    [provider]  pantoprazole (PROTONIX) 40 MG tablet Take 40 mg by mouth daily.    [provider]  sildenafil (VIAGRA) 100 MG tablet Take 100 mg by mouth daily as needed for erectile dysfunction.    [provider]  Testosterone 40.5 MG/2.5GM (1.62%) GEL Place onto the skin daily. Apply 1 packet daily    [provider]  varenicline (CHANTIX CONTINUING MONTH PAK) 1 MG tablet Take 1 tablet (1 mg total) by mouth 2 (two) times daily. Patient not taking: Reported on 08/11/2020 07/23/20   Minus Breeding, MD    Allergies    Patient has no known allergies.  Review of Systems   Review of Systems  Constitutional: Negative for fever.  Respiratory: Negative for cough and shortness of breath.   Cardiovascular: Negative for chest pain.  Gastrointestinal: Negative for vomiting.  Musculoskeletal: Positive for myalgias.  Neurological: Positive for weakness, numbness and headaches.  All other systems reviewed and are negative.   Physical Exam Updated Vital Signs BP 108/70   Pulse (!) 58   Temp 98.8 F (37.1 C) (Oral)   Resp 17   Ht 5\' 6"  (1.676 m)   Wt 61.2 kg   SpO2 100%   BMI 21.79 kg/m   Physical Exam Vitals and nursing note reviewed.  Constitutional:      General: He is not in acute distress.    Appearance: He is well-developed. He is not ill-appearing or diaphoretic.  HENT:     Head: Normocephalic and atraumatic.     Right Ear: External ear normal.     Left Ear: External ear normal.     Nose: Nose normal.  Eyes:     General:        Right eye: No discharge.        Left eye: No discharge.     Extraocular Movements: Extraocular movements intact.     Pupils: Pupils are equal, round, and reactive to light.  Cardiovascular:     Rate and Rhythm: Normal rate  and regular rhythm.     Heart sounds: Normal heart sounds.  Pulmonary:     Effort: Pulmonary effort is normal.     Breath sounds: Normal breath sounds.  Abdominal:     General: There is no distension.     Palpations: Abdomen is soft.     Tenderness: There  is no abdominal tenderness.  Musculoskeletal:     Cervical back: Normal range of motion and neck supple. No rigidity.  Skin:    General: Skin is warm and dry.  Neurological:     Mental Status: He is alert.     Comments: Patient has intact forehead muscle movement.  He has mild left facial droop.  He has mild left upper and left lower extremity weakness with subjectively decreased sensation in these extremities.  Mild arm drift of the left arm.  He barely over reaches with finger-to-nose on the left and is normal on the right.  Psychiatric:        Mood and Affect: Mood is not anxious.     ED Results / Procedures / Treatments   Labs (all labs ordered are listed, but only abnormal results are displayed) Labs Reviewed  COMPREHENSIVE METABOLIC PANEL - Abnormal; Notable for the following components:      Result Value   Creatinine, Ser 1.37 (*)    All other components within normal limits  LITHIUM LEVEL - Abnormal; Notable for the following components:   Lithium Lvl 0.53 (*)    All other components within normal limits  PROTIME-INR  APTT  CBC  DIFFERENTIAL    EKG EKG Interpretation  Date/Time:  Tuesday Sep 22 2020 11:50:43 EDT Ventricular Rate:  57 PR Interval:  184 QRS Duration: 100 QT Interval:  396 QTC Calculation: 385 R Axis:   79 Text Interpretation: Sinus bradycardia no acute ST/T changes similar to 2020 Confirmed by Sherwood Gambler [phone]) on 09/22/2020 12:40:26 PM   Radiology CT HEAD WO CONTRAST  Result Date: 09/22/2020 CLINICAL DATA:  Acute neuro deficit. Right-sided weakness, facial droop EXAM: CT HEAD WITHOUT CONTRAST TECHNIQUE: Contiguous axial images were obtained from the base of the skull through the  vertex without intravenous contrast. COMPARISON:  None. FINDINGS: Brain: No evidence of acute infarction, hemorrhage, hydrocephalus, extra-axial collection or mass lesion/mass effect. Vascular: Negative for hyperdense vessel Skull: Negative Sinuses/Orbits: Mucosal edema base of left maxillary sinus. Chronic bony thickening left maxillary sinus. There are adjacent periapical dental lucencies. This is likely odontogenic sinusitis. Negative orbit Other: None IMPRESSION: No significant intracranial abnormality Left maxillary sinus mucosal edema which is likely odontogenic. Electronically Signed   By: Franchot Gallo M.D.   On: 09/22/2020 13:04    Procedures Procedures   Medications Ordered in ED Medications  sodium chloride flush (NS) 0.9 % injection 3 mL (3 mLs Intravenous Not Given 09/22/20 1240)  LORazepam (ATIVAN) injection 2 mg (has no administration in time range)  sodium chloride 0.9 % bolus 1,000 mL (1,000 mLs Intravenous New Bag/Given 09/22/20 1416)    ED Course  I have reviewed the triage vital signs and the nursing notes.  Pertinent labs & imaging results that were available during my care of the patient were reviewed by me and considered in my medical decision making (see chart for details).    MDM Rules/Calculators/A&P                          Chart review shows that in 2019 he had a similar presentation but it was actually right sinus of the left side.  At that point his MRIs were actually benign.  He was ultimately diagnosed with a Bell's palsy.  This could be going on this time though would make sense for him to have the extremity weakness, though he had that last time as well.  He will need MRI.  Probably will at least need to talk to neuro after this. Care to Dr. Alvino Chapel.  Final Clinical Impression(s) / ED Diagnoses Final diagnoses:  None    Rx / DC Orders ED Discharge Orders    None       Sherwood Gambler, MD 09/22/20 1527

## 2020-09-22 NOTE — ED Provider Notes (Signed)
Emergency Medicine Provider Triage Evaluation Note  Louis Cardenas , a 55 y.o. male  was evaluated in triage.  Pt complains of left-sided facial droop, left upper extremity weakness beginning May 28.  Review of Systems  Positive: Left-sided facial droop, left arm weakness, headache Negative: Lower extremity weakness, confusion  Physical Exam  BP 122/73 (BP Location: Left Arm)   Pulse 71   Temp 98.8 F (37.1 C) (Oral)   Resp 16   SpO2 100%  Gen:   Awake, no distress   Resp:  Normal effort  MSK:    Other:  Left-sided facial droop slight left arm drift grip strength difference  Medical Decision Making  Medically screening exam initiated at 11:58 AM.  Appropriate orders placed.  Louis Cardenas was informed that the remainder of the evaluation will be completed by another provider, this initial triage assessment does not replace that evaluation, and the importance of remaining in the ED until their evaluation is complete.     Lorayne Bender, PA-C 09/22/20 1200    Isla Pence, MD 09/22/20 [phone]

## 2020-09-22 NOTE — ED Triage Notes (Signed)
Reports L sided facial droop and L arm weakness since Saturday.   Slight L sided arm drift.  Reports headache and body aches since Thursday.

## 2020-10-06 ENCOUNTER — Encounter: Payer: Self-pay | Admitting: Internal Medicine

## 2020-10-10 ENCOUNTER — Other Ambulatory Visit: Payer: Self-pay

## 2020-10-12 ENCOUNTER — Encounter: Payer: Self-pay | Admitting: Internal Medicine

## 2020-10-12 ENCOUNTER — Other Ambulatory Visit: Payer: Self-pay

## 2020-10-12 ENCOUNTER — Ambulatory Visit (INDEPENDENT_AMBULATORY_CARE_PROVIDER_SITE_OTHER): Payer: BC Managed Care – PPO | Admitting: Internal Medicine

## 2020-10-12 VITALS — BP 118/76 | HR 62 | Temp 98.1°F | Ht 66.0 in | Wt 143.0 lb

## 2020-10-12 DIAGNOSIS — G510 Bell's palsy: Secondary | ICD-10-CM | POA: Insufficient documentation

## 2020-10-12 MED ORDER — VALACYCLOVIR HCL 1 G PO TABS: 1000.0000 mg | ORAL_TABLET | Freq: Two times a day (BID) | ORAL | 0 refills | Status: DC

## 2020-10-12 NOTE — Patient Instructions (Signed)
Bell's Palsy, Adult  Bell's palsy is a short-term inability to move muscles in a part of the face. The inability to move, also called paralysis, results from inflammation or compression of the seventh cranial nerve. This nerve travels along the skull and under the ear to the side of the face. This nerve is responsible for facialmovements that include blinking, closing the eyes, smiling, and frowning. What are the causes? The exact cause of this condition is not known. It may be caused by an infection from a virus, such as the chickenpox (herpes zoster), Epstein-Barr, or mumps virus. What increases the risk? You are more likely to develop this condition if: You are pregnant. You have diabetes. You have had a recent infection in your nose, throat, or airways. You have a weakened body defense system (immune system). You have had a facial injury, such as a fracture. You have a family history of Bell's palsy. What are the signs or symptoms? Symptoms of this condition include: Weakness on one side of the face. Drooping eyelid and corner of the mouth. Excessive tearing in one eye. Difficulty closing the eyelid. Dry eye. Drooling. Dry mouth. Changes in taste. Change in facial appearance. Pain behind one ear. Ringing in one or both ears. Sensitivity to sound in one ear. Facial twitching. Headache. Impaired speech. Dizziness. Difficulty eating or drinking. Most of the time, only one side of the face is affected. In rare cases, Bell'spalsy may affect the whole face. How is this diagnosed? This condition is diagnosed based on: Your symptoms. Your medical history. A physical exam. You may also have to see health care providers who specialize in disorders of the nerves (neurologist) or diseases and conditions of the eye (ophthalmologist). You may have tests, such as: A test to check for nerve damage (electromyogram). Imaging studies, such as a CT scan or an MRI. Blood tests. How is this  treated? This condition affects every person differently. Sometimes symptoms go away without treatment within a couple weeks. If treatment is needed, it varies from person to person. The goal of treatment is to reduce inflammation and protect the eye from damage. Treatment for Bell's palsy may include: Medicines, such as: Steroids to reduce swelling and inflammation. Antiviral medicines. Pain relievers, including aspirin, acetaminophen, or ibuprofen. Eye drops or ointment to keep your eye moist. Eye protection, if you cannot close your eye. Exercises or massage to regain muscle strength and function (physical therapy). Follow these instructions at home:  Take over-the-counter and prescription medicines only as told by your health care provider. If your eye is affected: Keep your eye moist with eye drops or ointment as told by your health care provider. Follow instructions for eye care and protection as told by your health care provider. Do any physical therapy exercises as told by your health care provider. Keep all follow-up visits. This is important. Contact a health care provider if: You have a fever or chills. Your symptoms do not get better within 2-3 weeks, or your symptoms get worse. Your eye is red, irritated, or painful. You have new symptoms. Get help right away if: You have weakness or numbness in a part of your body other than your face. You have trouble swallowing. You develop neck pain or stiffness. You develop dizziness or shortness of breath. Summary Bell's palsy is a short-term inability to move muscles in a part of the face. The inability to move results from inflammation or compression of the facial nerve. This condition affects every person differently. Sometimes  symptoms go away without treatment within a couple weeks. If treatment is needed, it varies from person to person. The goal of treatment is to reduce inflammation and protect the eye from damage. Contact  your health care provider if your symptoms do not get better within 2-3 weeks, or your symptoms get worse. This information is not intended to replace advice given to you by your health care provider. Make sure you discuss any questions you have with your healthcare provider. Document Revised: 01/09/2020 Document Reviewed: 01/09/2020 Elsevier Patient Education  2022 [employer].

## 2020-10-12 NOTE — Progress Notes (Signed)
Subjective:  Patient ID: Louis Cardenas, male    DOB: January 12, 1966  Age: 55 y.o. MRN: [id number]  CC: Follow-up  This visit occurred during the SARS-CoV-2 public health emergency.  Safety protocols were in place, including screening questions prior to the visit, additional usage of staff PPE, and extensive cleaning of exam room while observing appropriate contact time as indicated for disinfecting solutions.    HPI [name] presents for f/up -  About 3 weeks ago he developed a left facial palsy.  He was seen in the emergency department and was diagnosed with Bell's palsy.  He is gotten some symptom relief with steroids but continues to complain of left facial weakness around his mouth.  Outpatient Medications Prior to Visit  Medication Sig Dispense Refill   artificial tears (LACRILUBE) OINT ophthalmic ointment Place into both eyes every 4 (four) hours as needed for dry eyes. 3.5 g 0   aspirin 81 MG chewable tablet Chew by mouth daily.     atorvastatin (LIPITOR) 40 MG tablet Take 40 mg by mouth daily.     clonazePAM (KLONOPIN) 0.5 MG tablet Take 0.5 mg by mouth as needed for anxiety.     clopidogrel (PLAVIX) 75 MG tablet Take 75 mg by mouth daily.     cyclobenzaprine (FLEXERIL) 10 MG tablet Take 1 tablet (10 mg total) by mouth 3 (three) times daily as needed for muscle spasms. This medicine may make you drowsy. Do not take it with clonazepam at the same time. 20 tablet 0   diclofenac Sodium (VOLTAREN) 1 % GEL Apply 4 g topically 4 (four) times daily. 100 g 0   lamoTRIgine (LAMICTAL) 200 MG tablet Take 200 mg by mouth daily.     lithium 300 MG tablet Take 300 mg by mouth daily.      nitroGLYCERIN (NITROSTAT) 0.4 MG SL tablet Place 0.4 mg under the tongue every 5 (five) minutes as needed for chest pain.     pantoprazole (PROTONIX) 40 MG tablet Take 40 mg by mouth daily.     predniSONE (DELTASONE) 20 MG tablet Take 2 tablets (40 mg total) by mouth daily. 14 tablet 0   sildenafil  (VIAGRA) 100 MG tablet Take 100 mg by mouth daily as needed for erectile dysfunction.     Testosterone 40.5 MG/2.5GM (1.62%) GEL Place onto the skin daily. Apply 1 packet daily     varenicline (CHANTIX CONTINUING MONTH PAK) 1 MG tablet Take 1 tablet (1 mg total) by mouth 2 (two) times daily. 60 tablet 2   No facility-administered medications prior to visit.    ROS Review of Systems  Constitutional:  Negative for diaphoresis and fatigue.  HENT: Negative.  Negative for facial swelling and nosebleeds.   Eyes: Negative.   Respiratory:  Negative for cough, chest tightness, shortness of breath and wheezing.   Cardiovascular:  Negative for chest pain, palpitations and leg swelling.  Gastrointestinal:  Negative for abdominal pain, constipation and diarrhea.  Endocrine: Negative.   Genitourinary: Negative.  Negative for hematuria.  Musculoskeletal: Negative.   Skin: Negative.  Negative for rash.  Neurological:  Positive for facial asymmetry. Negative for dizziness, seizures, weakness, numbness and headaches.  Hematological:  Negative for adenopathy. Bruises/bleeds easily.  Psychiatric/Behavioral: Negative.     Objective:  BP 118/76 (BP Location: Right Arm, Patient Position: Sitting, Cuff Size: Large)   Pulse 62   Temp 98.1 F (36.7 C) (Oral)   Ht 5\' 6"  (1.676 m)   Wt 143 lb (64.9 kg)  SpO2 98%   BMI 23.08 kg/m   BP Readings from Last 3 Encounters:  10/12/20 118/76  09/22/20 111/77  08/11/20 116/70    Wt Readings from Last 3 Encounters:  10/12/20 143 lb (64.9 kg)  09/22/20 135 lb (61.2 kg)  08/11/20 144 lb 12.8 oz (65.7 kg)    Physical Exam Vitals reviewed.  Constitutional:      Appearance: Normal appearance.  HENT:     Head: Normocephalic and atraumatic.     Salivary Glands: Right salivary gland is not diffusely enlarged or tender. Left salivary gland is not diffusely enlarged or tender.     Nose: Nose normal.     Mouth/Throat:     Mouth: Mucous membranes are moist.   Eyes:     General: No visual field deficit or scleral icterus.    Extraocular Movements: Extraocular movements intact.     Pupils: Pupils are equal, round, and reactive to light.  Cardiovascular:     Rate and Rhythm: Normal rate and regular rhythm.     Heart sounds: No murmur heard. Pulmonary:     Effort: Pulmonary effort is normal.     Breath sounds: No stridor. No wheezing, rhonchi or rales.  Abdominal:     General: Abdomen is flat.     Palpations: There is no mass.     Tenderness: There is no abdominal tenderness. There is no guarding.  Musculoskeletal:        General: Normal range of motion.     Cervical back: Neck supple.     Right lower leg: No edema.     Left lower leg: No edema.  Lymphadenopathy:     Cervical: No cervical adenopathy.  Skin:    General: Skin is warm and dry.     Findings: Bruising present. No rash.  Neurological:     Mental Status: He is alert.     Cranial Nerves: Cranial nerve deficit and facial asymmetry present. No dysarthria.     Sensory: Sensation is intact.     Motor: Motor function is intact.     Coordination: Coordination is intact.     Comments: Very mild left peripheral facial palsy  Psychiatric:        Thought Content: Thought content normal.    Lab Results  Component Value Date   WBC 7.9 09/22/2020   HGB 14.1 09/22/2020   HCT 44.7 09/22/2020   PLT 286 09/22/2020   GLUCOSE 91 09/22/2020   CHOL 118 07/08/2020   TRIG 42.0 07/08/2020   HDL 50.10 07/08/2020   LDLCALC 60 07/08/2020   ALT 31 09/22/2020   AST 24 09/22/2020   NA 137 09/22/2020   K 4.6 09/22/2020   CL 106 09/22/2020   CREATININE 1.37 (H) 09/22/2020   BUN 15 09/22/2020   CO2 24 09/22/2020   TSH 0.63 07/08/2020   PSA 0.48 07/08/2020   INR 1.0 09/22/2020    CT HEAD WO CONTRAST  Result Date: 09/22/2020 CLINICAL DATA:  Acute neuro deficit. Right-sided weakness, facial droop EXAM: CT HEAD WITHOUT CONTRAST TECHNIQUE: Contiguous axial images were obtained from the base  of the skull through the vertex without intravenous contrast. COMPARISON:  None. FINDINGS: Brain: No evidence of acute infarction, hemorrhage, hydrocephalus, extra-axial collection or mass lesion/mass effect. Vascular: Negative for hyperdense vessel Skull: Negative Sinuses/Orbits: Mucosal edema base of left maxillary sinus. Chronic bony thickening left maxillary sinus. There are adjacent periapical dental lucencies. This is likely odontogenic sinusitis. Negative orbit Other: None IMPRESSION: No significant intracranial abnormality  Left maxillary sinus mucosal edema which is likely odontogenic. Electronically Signed   By: Franchot Gallo M.D.   On: 09/22/2020 13:04   MR BRAIN WO CONTRAST  Result Date: 09/22/2020 CLINICAL DATA:  Neuro deficit, acute stroke suspected. Left-sided weakness. EXAM: MRI HEAD WITHOUT CONTRAST TECHNIQUE: Multiplanar, multiecho pulse sequences of the brain and surrounding structures were obtained without intravenous contrast. COMPARISON:  Same day CT head. FINDINGS: Brain: No acute infarction, hemorrhage, hydrocephalus, extra-axial collection or mass lesion. Vascular: Major arterial flow voids are maintained skull base. Skull and upper cervical spine: Normal marrow signal. Sinuses/Orbits: Left maxillary sinus mucosal thickening. Other: No mastoid effusions. IMPRESSION: 1. No evidence of acute intracranial abnormality. Specifically, no acute infarct. 2. Left maxillary sinus mucosal thickening. Electronically Signed   By: Margaretha Sheffield MD   On: 09/22/2020 17:46    Assessment & Plan:   Dimarco was seen today for follow-up.  Diagnoses and all orders for this visit:  Left-sided Bell's palsy- Some improvement noted with steroids.  Will try a course of valacyclovir in the event that there is a viral (herpes) component. -     valACYclovir (VALTREX) 1000 MG tablet; Take 1 tablet (1,000 mg total) by mouth 2 (two) times daily for 7 days.  I am having Kristeen Miss start on  valACYclovir. I am also having him maintain his Testosterone, aspirin, atorvastatin, clonazePAM, clopidogrel, lamoTRIgine, lithium, nitroGLYCERIN, pantoprazole, sildenafil, cyclobenzaprine, diclofenac Sodium, varenicline, predniSONE, and artificial tears.  Meds ordered this encounter  Medications   valACYclovir (VALTREX) 1000 MG tablet    Sig: Take 1 tablet (1,000 mg total) by mouth 2 (two) times daily for 7 days.    Dispense:  14 tablet    Refill:  0      Follow-up: Return in about 3 months (around 01/12/2021).  Scarlette Calico, MD

## 2020-10-14 ENCOUNTER — Encounter: Payer: Self-pay | Admitting: Gastroenterology

## 2020-10-14 ENCOUNTER — Telehealth: Payer: Self-pay | Admitting: Nurse Practitioner

## 2020-10-14 NOTE — Telephone Encounter (Signed)
Will need Plavix washout prior to endoscopy. Please reschedule. Thank you.

## 2020-10-14 NOTE — Telephone Encounter (Signed)
Inbound call from patient inquiring about Plavix medication and when he needs to stop taking it.  Informed patient per note from 07/2020, needed to stop 5 days prior.  Stated he did not remember and took medication up to yesterday.  Will definitely hold today.  Ok to proceed with procedures with Dr. Tarri Glenn on 10/16/20 or will he need to reschedule?  Please advise.

## 2020-10-14 NOTE — Telephone Encounter (Signed)
Please review and advise if pt will need to reschedule procedure.

## 2020-10-14 NOTE — Telephone Encounter (Signed)
Called pt to advise procedure will need to be rescheduled per Dr. Tarri Glenn. Pt expressed understanding. Pt has been rescheduled to 11/30/20 @ 2:30pm. Reminded when to begin holding Plavix (5 days prior to procedure) and verbalized understanding. Pt also confirmed he still has his prep instructions to refer back to as the date approaches.

## 2020-10-16 ENCOUNTER — Encounter: Payer: BC Managed Care – PPO | Admitting: Gastroenterology

## 2020-10-27 ENCOUNTER — Ambulatory Visit: Payer: BC Managed Care – PPO | Admitting: Endocrinology

## 2020-11-25 ENCOUNTER — Other Ambulatory Visit: Payer: Self-pay | Admitting: Internal Medicine

## 2020-11-25 DIAGNOSIS — G510 Bell's palsy: Secondary | ICD-10-CM

## 2020-11-30 ENCOUNTER — Ambulatory Visit (AMBULATORY_SURGERY_CENTER): Payer: BC Managed Care – PPO | Admitting: Gastroenterology

## 2020-11-30 ENCOUNTER — Other Ambulatory Visit: Payer: Self-pay

## 2020-11-30 ENCOUNTER — Encounter: Payer: Self-pay | Admitting: Gastroenterology

## 2020-11-30 VITALS — BP 123/66 | HR 59 | Temp 98.9°F | Resp 16 | Ht 66.0 in | Wt 144.0 lb

## 2020-11-30 DIAGNOSIS — Z800 Family history of malignant neoplasm of digestive organs: Secondary | ICD-10-CM

## 2020-11-30 DIAGNOSIS — D125 Benign neoplasm of sigmoid colon: Secondary | ICD-10-CM | POA: Diagnosis not present

## 2020-11-30 DIAGNOSIS — Z86010 Personal history of colonic polyps: Secondary | ICD-10-CM | POA: Diagnosis present

## 2020-11-30 DIAGNOSIS — K2970 Gastritis, unspecified, without bleeding: Secondary | ICD-10-CM

## 2020-11-30 DIAGNOSIS — K219 Gastro-esophageal reflux disease without esophagitis: Secondary | ICD-10-CM

## 2020-11-30 DIAGNOSIS — D122 Benign neoplasm of ascending colon: Secondary | ICD-10-CM

## 2020-11-30 DIAGNOSIS — K2100 Gastro-esophageal reflux disease with esophagitis, without bleeding: Secondary | ICD-10-CM

## 2020-11-30 DIAGNOSIS — K625 Hemorrhage of anus and rectum: Secondary | ICD-10-CM

## 2020-11-30 DIAGNOSIS — K5900 Constipation, unspecified: Secondary | ICD-10-CM

## 2020-11-30 DIAGNOSIS — K3189 Other diseases of stomach and duodenum: Secondary | ICD-10-CM

## 2020-11-30 DIAGNOSIS — K635 Polyp of colon: Secondary | ICD-10-CM

## 2020-11-30 MED ORDER — SODIUM CHLORIDE 0.9 % IV SOLN: 500.0000 mL | Freq: Once | INTRAVENOUS | Status: DC

## 2020-11-30 NOTE — Progress Notes (Signed)
Patient was tossing in the bed with pain when I went in to the recovery room. I went in to help and he was passing gas and still thrashing. He rated his pain between 6-9 out of 10 in his right side. We talked about his pain and the plan for him to go to the ER. He said it could possibly kidney stones because he has a history of those. In previous years he has gone to the ER and given dilaudid. "Diluadid helps him when he has kidney stones." I explained that we do not have this so he would have to get his care partner to drive him. When I told him this and that he could not go by EMS his pain went to a 2/10. He and his care partner both understand that if his pain goes back up he needs to go straight to the ER. He understood and had no further questions. He then gets dressed and while the RN is going over discharge instructions with his care partner, he falls to the floor on his knee. He catches himself with the chair but his knee hits the floor. We immediately pull the curtain and assess him. He swears that he is fine, there is currently no bruising or issues with his knee. Again, he and his care partner (family) was told if anything changes, he can call us. They both agreed and headed home.

## 2020-11-30 NOTE — Patient Instructions (Signed)
Resume Plavix in 2 days   Handouts on polyps,diverticulosis, & hemorrhoids given to you today  Await pathology results on polyps removed   Handout on gastritis given to you today  Await stomach and esophageal biopsy results   Referral to surgeon will be made for removal perianal lesion     YOU HAD AN ENDOSCOPIC PROCEDURE TODAY AT [facility]:   Refer to the procedure report that was given to you for any specific questions about what was found during the examination.  If the procedure report does not answer your questions, please call your gastroenterologist to clarify.  If you requested that your care partner not be given the details of your procedure findings, then the procedure report has been included in a sealed envelope for you to review at your convenience later.  YOU SHOULD EXPECT: Some feelings of bloating in the abdomen. Passage of more gas than usual.  Walking can help get rid of the air that was put into your GI tract during the procedure and reduce the bloating. If you had a lower endoscopy (such as a colonoscopy or flexible sigmoidoscopy) you may notice spotting of blood in your stool or on the toilet paper. If you underwent a bowel prep for your procedure, you may not have a normal bowel movement for a few days.  Please Note:  You might notice some irritation and congestion in your nose or some drainage.  This is from the oxygen used during your procedure.  There is no need for concern and it should clear up in a day or so.  SYMPTOMS TO REPORT IMMEDIATELY:  Following lower endoscopy (colonoscopy or flexible sigmoidoscopy):  Excessive amounts of blood in the stool  Significant tenderness or worsening of abdominal pains  Swelling of the abdomen that is new, acute  Fever of 100F or higher  Following upper endoscopy (EGD)  Vomiting of blood or coffee ground material  New chest pain or pain under the shoulder blades  Painful or persistently difficult  swallowing  New shortness of breath  Fever of 100F or higher  Black, tarry-looking stools  For urgent or emergent issues, a gastroenterologist can be reached at any hour by calling [phone]. Do not use MyChart messaging for urgent concerns.    DIET:  We do recommend a small meal at first, but then you may proceed to your regular diet.  Drink plenty of fluids but you should avoid alcoholic beverages for 24 hours.  ACTIVITY:  You should plan to take it easy for the rest of today and you should NOT DRIVE or use heavy machinery until tomorrow (because of the sedation medicines used during the test).    FOLLOW UP: Our staff will call the number listed on your records 48-72 hours following your procedure to check on you and address any questions or concerns that you may have regarding the information given to you following your procedure. If we do not reach you, we will leave a message.  We will attempt to reach you two times.  During this call, we will ask if you have developed any symptoms of COVID 19. If you develop any symptoms (ie: fever, flu-like symptoms, shortness of breath, cough etc.) before then, please call [phone].  If you test positive for Covid 19 in the 2 weeks post procedure, please call and report this information to [place].    If any biopsies were taken you will be contacted by phone or by letter within the next  1-3 weeks.  Please call us at [phone] if you have not heard about the biopsies in 3 weeks.    SIGNATURES/CONFIDENTIALITY: You and/or your care partner have signed paperwork which will be entered into your electronic medical record.  These signatures attest to the fact that that the information above on your After Visit Summary has been reviewed and is understood.  Full responsibility of the confidentiality of this discharge information lies with you and/or your care-partner.

## 2020-11-30 NOTE — Progress Notes (Signed)
Called to room to assist during endoscopic procedure.  Patient ID and intended procedure confirmed with present staff. Received instructions for my participation in the procedure from the performing physician.  

## 2020-11-30 NOTE — Op Note (Signed)
[facility] Patient Name: Louis Cardenas Procedure Date: 11/30/2020 2:22 PM MRN: [id number] Endoscopist: Thornton Park MD, MD Age: 55 Referring MD:  Date of Birth: June 06, 1965 Gender: Male Account #: [id number] Procedure:                Upper GI endoscopy Indications:              Esophageal reflux symptoms that persist despite                            appropriate therapy, Hematemesis Medicines:                Monitored Anesthesia Care Procedure:                Pre-Anesthesia Assessment:                           - Prior to the procedure, a History and Physical                            was performed, and patient medications and                            allergies were reviewed. The patient's tolerance of                            previous anesthesia was also reviewed. The risks                            and benefits of the procedure and the sedation                            options and risks were discussed with the patient.                            All questions were answered, and informed consent                            was obtained. Prior Anticoagulants: The patient has                            taken Plavix (clopidogrel), last dose was 5 days                            prior to procedure. ASA Grade Assessment: III - A                            patient with severe systemic disease. After                            reviewing the risks and benefits, the patient was                            deemed in satisfactory condition to undergo the  procedure.                           After obtaining informed consent, the endoscope was                            passed under direct vision. Throughout the                            procedure, the patient's blood pressure, pulse, and                            oxygen saturations were monitored continuously. The                            GIF [id number] was introduced through the                             mouth, and advanced to the third part of duodenum.                            The upper GI endoscopy was accomplished without                            difficulty. The patient tolerated the procedure                            well. Scope In: Scope Out: Findings:                 The examined esophagus was normal except for mild                            congestion at the z-line. Z-line is located 38cm                            from the incisors. Biopsies were obtained from the                            mid/proximal and distal esophagus with cold forceps                            for histology of suspected eosinophilic esophagitis.                           Patchy mild inflammation characterized by erythema                            granularity was found in the gastric body. Biopsies                            were taken from the antrum, body, and fundus with a                            cold forceps for  histology. Estimated blood loss                            was minimal.                           The examined duodenum was normal. Complications:            No immediate complications. Estimated blood loss:                            Minimal. Estimated Blood Loss:     Estimated blood loss was minimal. Impression:               - Normal esophagus. Biopsied.                           - Mild gastritis. Biopsied.                           - Normal examined duodenum. Recommendation:           - Patient has a contact number available for                            emergencies. The signs and symptoms of potential                            delayed complications were discussed with the                            patient. Return to normal activities tomorrow.                            Written discharge instructions were provided to the                            patient.                           - Resume previous diet.                           - Continue present  medications.                           - Await pathology results.                           - Proceed with colonoscopy today as previously                            planned. Thornton Park MD, MD 11/30/2020 3:06:50 PM This report has been signed electronically.

## 2020-11-30 NOTE — Progress Notes (Signed)
Pt Drowsy. VSS. To PACU, report to RN. No anesthetic complications noted.  

## 2020-11-30 NOTE — Progress Notes (Signed)
VS-CW 

## 2020-11-30 NOTE — Op Note (Signed)
[facility] Patient Name: Louis Cardenas Procedure Date: 11/30/2020 2:22 PM MRN: [id number] Endoscopist: Thornton Park MD, MD Age: 55 Referring MD:  Date of Birth: 1965/09/02 Gender: Male Account #: [id number] Procedure:                Colonoscopy Indications:              Surveillance: Personal history of adenomatous                            polyps on last colonoscopy > 5 years ago                           Colonoscopy 07/11/14 with Dr. Wayland Salinas in [place]: 5                            tubular adenomas, surveillance recommended in 5                            years                           Father with colon cancer at age 30                           Incidental report: rectal bleeding Medicines:                Monitored Anesthesia Care Procedure:                Pre-Anesthesia Assessment:                           - Prior to the procedure, a History and Physical                            was performed, and patient medications and                            allergies were reviewed. The patient's tolerance of                            previous anesthesia was also reviewed. The risks                            and benefits of the procedure and the sedation                            options and risks were discussed with the patient.                            All questions were answered, and informed consent                            was obtained. Prior Anticoagulants: The patient has  taken Plavix (clopidogrel), last dose was 5 days                            prior to procedure. ASA Grade Assessment: III - A                            patient with severe systemic disease. After                            reviewing the risks and benefits, the patient was                            deemed in satisfactory condition to undergo the                            procedure.                           After obtaining informed consent, the colonoscope                             was passed under direct vision. Throughout the                            procedure, the patient's blood pressure, pulse, and                            oxygen saturations were monitored continuously. The                            CF [id number] was introduced through the anus                            and advanced to the 3 cm into the ileum. A second                            forward view of the right colon was performed. The                            colonoscopy was performed without difficulty. The                            patient tolerated the procedure well. The quality                            of the bowel preparation was good. The terminal                            ileum, ileocecal valve, appendiceal orifice, and                            rectum were photographed. Scope In: 2:41:40 PM Scope Out: 3:02:54 PM Scope Withdrawal Time: 0 hours 15 minutes 42 seconds  Total Procedure Duration: 0 hours 21 minutes 14 seconds  Findings:                 The perianal exam was abnormal. There is a                            posterior, pearly lesion with raised edges of                            unclear clinical significance.                           Non-bleeding internal hemorrhoids were found.                           Multiple small and large-mouthed diverticula were                            found in the sigmoid colon and descending colon.                           A 6-8 mm polyp was found in the sigmoid colon. The                            polyp was semi-pedunculated. The polyp was removed                            with a cold snare. Resection and retrieval were                            complete. Estimated blood loss was minimal.                           Two sessile polyps were found in the ascending                            colon. The polyps were 4 to 8 mm in size. These                            polyps were removed with a cold snare. Resection                             and retrieval were complete. Estimated blood loss                            was minimal.                           The exam was otherwise without abnormality on                            direct and retroflexion views. Complications:            No immediate complications. Estimated blood loss:  Minimal. Estimated Blood Loss:     Estimated blood loss was minimal. Impression:               - Abnormal perianal exam.                           - Non-bleeding internal hemorrhoids.                           - Diverticulosis in the sigmoid colon and in the                            descending colon.                           - One 8 mm polyp in the sigmoid colon, removed with                            a cold snare. Resected and retrieved.                           - Two 4 to 8 mm polyps in the ascending colon,                            removed with a cold snare. Resected and retrieved.                           - The examination was otherwise normal on direct                            and retroflexion views. Recommendation:           - Patient has a contact number available for                            emergencies. The signs and symptoms of potential                            delayed complications were discussed with the                            patient. Return to normal activities tomorrow.                            Written discharge instructions were provided to the                            patient.                           - Resume previous diet.                           - Continue present medications.                           -  Resume Plavix (clopidogrel) at prior dose in 2                            days.                           - Await pathology results.                           - Repeat colonoscopy date to be determined after                            pending pathology results are reviewed for                             surveillance.                           - Refer to a surgeon for biopsy of the perianal                            lesion. Thornton Park MD, MD 11/30/2020 3:12:17 PM This report has been signed electronically.

## 2020-11-30 NOTE — Progress Notes (Signed)
Pt in no distress when admitted to recovery room the patient was peaceful ,abd soft ,passing flatus well . Approx 10 minutes after adm to recovery rm ,after sister and husband came into recovery rm ,pt moaned and shouted out with right lower side pain ,pt would pass flatus well each position he turned to ,but would be comfortable a minute or so then moan out and sit up in bed c/o rt lower side pain. VSS ,abdomen very soft . Dr Tarri Glenn checked pt and since pt felt this was kidney stone pain he was experiencing she suggested he go to ER . Pt continued to pass flatus easily and then ended up saying he felt better and did not want to go to ER at this point . Dr Tarri Glenn was informed by Andreas Ohm ,RN and pt dressed for D/C . While giving d/c instructions to pt's sister and pt's husband pt fell to floor on his left knee. Pt said he was fine and only hit his knee. Pulled pt's pants leg up and looked at knee, there was no redness,bruise or swelling of knee noted . Dr Tarri Glenn made aware the patient dischargedper orders wheelchair at which time pt was not complaining of rt side discomfort or knee discomfort . Pt and care partner were instructed to call back if any problems related to procedures today and if pain that pt described as kidney stone pain returned for him to go to ER or his primary MD. Pt had said he has had many kidney stones and that is what the pain he had experienced earlier in recovery room felt like.pt d/c'd to car per wheel chair by M.Tillman Abide

## 2020-12-01 ENCOUNTER — Telehealth: Payer: Self-pay

## 2020-12-01 NOTE — Telephone Encounter (Signed)
-----   Message from Thornton Park, MD sent at 11/30/2020  3:41 PM EDT ----- Please make a referral to [facility] for biopsy of a perianal lesion. Thank you.  KLB

## 2020-12-01 NOTE — Telephone Encounter (Signed)
Referral faxed to [facility] 

## 2020-12-02 ENCOUNTER — Telehealth: Payer: Self-pay

## 2020-12-02 ENCOUNTER — Other Ambulatory Visit: Payer: Self-pay | Admitting: Internal Medicine

## 2020-12-02 DIAGNOSIS — G510 Bell's palsy: Secondary | ICD-10-CM

## 2020-12-02 NOTE — Telephone Encounter (Signed)
  Follow up Call-  Call back number 11/30/2020  Post procedure Call Back phone  # [phone]  Permission to leave phone message Yes     Patient questions:  Do you have a fever, pain , or abdominal swelling? No. Pain Score  0 *  Have you tolerated food without any problems? Yes.    Have you been able to return to your normal activities? Yes.    Do you have any questions about your discharge instructions: Diet   No. Medications  No. Follow up visit  No.  Do you have questions or concerns about your Care? No.  Actions: * If pain score is 4 or above: No action needed, pain <4.

## 2020-12-03 ENCOUNTER — Encounter: Payer: Self-pay | Admitting: Gastroenterology

## 2020-12-12 ENCOUNTER — Encounter: Payer: Self-pay | Admitting: Internal Medicine

## 2021-01-10 DIAGNOSIS — Z720 Tobacco use: Secondary | ICD-10-CM | POA: Insufficient documentation

## 2021-01-10 NOTE — Progress Notes (Signed)
Cardiology Office Note   Date:  01/11/2021   ID:  Louis Cardenas, DOB Mar 15, 1966, MRN [id number]  PCP:  Janith Lima, MD  Cardiologist:   None Referring:  Janith Lima, MD  Chief Complaint  Patient presents with   Coronary Artery Disease       History of Present Illness: Louis Cardenas is a 55 y.o. male who was referred by Janith Lima, MD for evaluation of CAD.  He had a myocardial infarction 2011 in [place].   He unfortunately continues to smoke cigarettes though he is down to 1/2 pack a day from 2 packs/day.  He is not exercising like he was previously.  He does have some shortness of breath but he is not having any chest pressure, neck or arm discomfort.  He is not having any palpitations, presyncope or syncope.  I did go back and review his stress test.  He had a POET (Plain Old Exercise Treadmill).  While there was no evidence of ischemic ST changes he did drop his blood pressure during peak exercise.  It fell from the 160s to 90s but then recovered quickly.    Past Medical History:  Diagnosis Date   Anxiety    Bipolar 1 disorder ([place])    CAD (coronary artery disease)    Depressed    Eating disorder    bulimia   Family history of colon cancer in father    GERD (gastroesophageal reflux disease)    History of colon polyps    Hypertension    Hypogonadism in male    MI (myocardial infarction) ([place])    Stroke ([place])    Thyroid nodule     Past Surgical History:  Procedure Laterality Date   APPENDECTOMY  1998   COLONOSCOPY     CORONARY ANGIOPLASTY WITH STENT PLACEMENT     ROTATOR CUFF REPAIR Left    with screws   UPPER GASTROINTESTINAL ENDOSCOPY       Current Outpatient Medications  Medication Sig Dispense Refill   artificial tears (LACRILUBE) OINT ophthalmic ointment Place into both eyes every 4 (four) hours as needed for dry eyes. 3.5 g 0   aspirin 81 MG chewable tablet Chew by mouth daily.     atorvastatin (LIPITOR) 40 MG tablet Take  40 mg by mouth daily.     ciclopirox (LOPROX) 0.77 % SUSP Apply topically daily as needed.     clonazePAM (KLONOPIN) 0.5 MG tablet Take 0.5 mg by mouth as needed for anxiety.     clopidogrel (PLAVIX) 75 MG tablet Take 75 mg by mouth daily.     cyclobenzaprine (FLEXERIL) 10 MG tablet Take 1 tablet (10 mg total) by mouth 3 (three) times daily as needed for muscle spasms. This medicine may make you drowsy. Do not take it with clonazepam at the same time. 20 tablet 0   desonide (DESOWEN) 0.05 % lotion Apply topically daily as needed.     diclofenac Sodium (VOLTAREN) 1 % GEL Apply 4 g topically 4 (four) times daily. 100 g 0   lamoTRIgine (LAMICTAL) 200 MG tablet Take 200 mg by mouth daily.     lithium 300 MG tablet Take 300 mg by mouth daily.      metoprolol tartrate (LOPRESSOR) 50 MG tablet TAKE 1 TABLET 2 HR PRIOR TO CARDIAC PROCEDURE 1 tablet 0   nitroGLYCERIN (NITROSTAT) 0.4 MG SL tablet Place 0.4 mg under the tongue every 5 (five) minutes as needed for chest pain.  pantoprazole (PROTONIX) 40 MG tablet Take 40 mg by mouth daily.     sildenafil (VIAGRA) 100 MG tablet Take 100 mg by mouth daily as needed for erectile dysfunction.     Testosterone 40.5 MG/2.5GM (1.62%) GEL Place onto the skin daily. Apply 1 packet daily     TRINTELLIX 10 MG TABS tablet Take 10 mg by mouth daily.     varenicline (CHANTIX CONTINUING MONTH PAK) 1 MG tablet Take 1 tablet (1 mg total) by mouth 2 (two) times daily. 60 tablet 2   VRAYLAR 1.5 MG capsule Take 1.5 mg by mouth daily.     hydrOXYzine (ATARAX/VISTARIL) 25 MG tablet Take 25 mg by mouth 2 (two) times daily as needed. (Patient not taking: Reported on 01/11/2021)     No current facility-administered medications for this visit.    Allergies:   Patient has no known allergies.    ROS:  Please see the history of present illness.   Otherwise, review of systems are positive for none.   All other systems are reviewed and negative.    PHYSICAL EXAM: VS:  BP 117/62    Pulse 68   Ht 5' 5.5" (1.664 m)   Wt 133 lb 12.8 oz (60.7 kg)   SpO2 98%   BMI 21.93 kg/m  , BMI Body mass index is 21.93 kg/m. GENERAL:  Well appearing NECK:  No jugular venous distention, waveform within normal limits, carotid upstroke brisk and symmetric, no bruits, no thyromegaly LUNGS:  Clear to auscultation bilaterally CHEST:  Unremarkable HEART:  PMI not displaced or sustained,S1 and S2 within normal limits, no S3, no S4, no clicks, no rubs, no murmurs ABD:  Flat, positive bowel sounds normal in frequency in pitch, no bruits, no rebound, no guarding, no midline pulsatile mass, no hepatomegaly, no splenomegaly EXT:  2 plus pulses throughout, no edema, no cyanosis no clubbing   EKG:  EKG is not ordered today.   Recent Labs: 07/08/2020: TSH 0.63 09/22/2020: ALT 31; BUN 15; Creatinine, Ser 1.37; Hemoglobin 14.1; Platelets 286; Potassium 4.6; Sodium 137    Lipid Panel    Component Value Date/Time   CHOL 118 07/08/2020 1154   TRIG 42.0 07/08/2020 1154   HDL 50.10 07/08/2020 1154   CHOLHDL 2 07/08/2020 1154   VLDL 8.4 07/08/2020 1154   LDLCALC 60 07/08/2020 1154      Wt Readings from Last 3 Encounters:  01/11/21 133 lb 12.8 oz (60.7 kg)  11/30/20 144 lb (65.3 kg)  10/12/20 143 lb (64.9 kg)      Other studies Reviewed: Additional studies/ records that were reviewed today include: POET (Plain Old Exercise Treadmill). Review of the above records demonstrates:  Please see elsewhere in the note.     ASSESSMENT AND PLAN:  CAD:   Given his ongoing risk factors, mild shortness of breath and just a sensation he has that he might have another heart attack along with the abnormal POET (Plain Old Exercise Treadmill) with a drop in his blood pressure I am going to order a coronary CTA.   DYSLIPIDEMIA: LDL was 60 with an HDL of 58.  No change in therapy.   TOBACCO ABUSE:   We talked again about this.  We will get a look into over-the-counter nicotine  replacements.   Current medicines are reviewed at length with the patient today.  The patient does not have concerns regarding medicines.  The following changes have been made: None  Labs/ tests ordered today include:   Orders Placed This  Encounter  Procedures   CT CORONARY MORPH W/CTA COR W/SCORE W/CA W/CM &/OR WO/CM   Basic metabolic panel      Disposition:   FU with me in 1 months.     Signed, Minus Breeding, MD  01/11/2021 11:43 AM    [facility]

## 2021-01-11 ENCOUNTER — Encounter: Payer: Self-pay | Admitting: Cardiology

## 2021-01-11 ENCOUNTER — Other Ambulatory Visit: Payer: Self-pay

## 2021-01-11 ENCOUNTER — Ambulatory Visit (INDEPENDENT_AMBULATORY_CARE_PROVIDER_SITE_OTHER): Payer: BC Managed Care – PPO | Admitting: Cardiology

## 2021-01-11 VITALS — BP 117/62 | HR 68 | Ht 65.5 in | Wt 133.8 lb

## 2021-01-11 DIAGNOSIS — R072 Precordial pain: Secondary | ICD-10-CM

## 2021-01-11 DIAGNOSIS — I2510 Atherosclerotic heart disease of native coronary artery without angina pectoris: Secondary | ICD-10-CM

## 2021-01-11 DIAGNOSIS — E785 Hyperlipidemia, unspecified: Secondary | ICD-10-CM | POA: Diagnosis not present

## 2021-01-11 DIAGNOSIS — Z720 Tobacco use: Secondary | ICD-10-CM | POA: Diagnosis not present

## 2021-01-11 LAB — BASIC METABOLIC PANEL
BUN/Creatinine Ratio: 14 (ref 9–20)
BUN: 19 mg/dL (ref 6–24)
CO2: 22 mmol/L (ref 20–29)
Calcium: 10.4 mg/dL — ABNORMAL HIGH (ref 8.7–10.2)
Chloride: 107 mmol/L — ABNORMAL HIGH (ref 96–106)
Creatinine, Ser: 1.38 mg/dL — ABNORMAL HIGH (ref 0.76–1.27)
Glucose: 103 mg/dL — ABNORMAL HIGH (ref 65–99)
Potassium: 5.2 mmol/L (ref 3.5–5.2)
Sodium: 142 mmol/L (ref 134–144)
eGFR: 61 mL/min/{1.73_m2} (ref >59)

## 2021-01-11 MED ORDER — METOPROLOL TARTRATE 50 MG PO TABS: ORAL_TABLET | ORAL | 0 refills | Status: DC

## 2021-01-11 NOTE — Patient Instructions (Addendum)
Medication Instructions:  Your Physician recommend you continue on your current medication as directed.    *If you need a refill on your cardiac medications before your next appointment, please call your pharmacy*   Lab Work: Your physician recommends lab work today (BMP).  If you have labs (blood work) drawn today and your tests are completely normal, you will receive your results only by: [place] (if you have MyChart) OR A paper copy in the mail If you have any lab test that is abnormal or we need to change your treatment, we will call you to review the results.   Testing/Procedures: Cardiac CT Angiography (CTA), is a special type of CT scan that uses a computer to produce multi-dimensional views of major blood vessels throughout the body. In CT angiography, a contrast material is injected through an IV to help visualize the blood vessels [facility]   Follow-Up: At [facility], you and your health needs are our priority.  As part of our continuing mission to provide you with exceptional heart care, we have created designated Provider Care Teams.  These Care Teams include your primary Cardiologist (physician) and Advanced Practice Providers (APPs -  Physician Assistants and Nurse Practitioners) who all work together to provide you with the care you need, when you need it.  We recommend signing up for the patient portal called "MyChart".  Sign up information is provided on this After Visit Summary.  MyChart is used to connect with patients for Virtual Visits (Telemedicine).  Patients are able to view lab/test results, encounter notes, upcoming appointments, etc.  Non-urgent messages can be sent to your provider as well.   To learn more about what you can do with MyChart, go to [email or web address].    Your next appointment:   1 year(s)  The format for your next appointment:   In Person  Provider:   Minus Breeding, MD    Your cardiac CT will be scheduled at  one of the below locations:   [facility] [address] [place], [place] [address] [phone]   If scheduled at [facility], please arrive at the [facility] main entrance (entrance A) of [facility] 30 minutes prior to test start time. Proceed to the [facility] Radiology Department (first floor) to check-in and test prep.  If scheduled at [facility], please arrive 15 mins early for check-in and test prep.  Please follow these instructions carefully (unless otherwise directed):  Hold all erectile dysfunction medications at least 3 days (72 hrs) prior to test.  On the Night Before the Test: Be sure to Drink plenty of water. Do not consume any caffeinated/decaffeinated beverages or chocolate 12 hours prior to your test. Do not take any antihistamines 12 hours prior to your test.   On the Day of the Test: Drink plenty of water until 1 hour prior to the test. Do not eat any food 4 hours prior to the test. You may take your regular medications prior to the test.  Take metoprolol (Lopressor) 50 mg  two hours prior to test.          After the Test: Drink plenty of water. After receiving IV contrast, you may experience a mild flushed feeling. This is normal. On occasion, you may experience a mild rash up to 24 hours after the test. This is not dangerous. If this occurs, you can take Benadryl 25 mg and increase your fluid intake. If you experience trouble breathing,  this can be serious. If it is severe call 911 IMMEDIATELY. If it is mild, please call our office. If you take any of these medications: Glipizide/Metformin, Avandament, Glucavance, please do not take 48 hours after completing test unless otherwise instructed.  Please allow 2-4 weeks for scheduling of routine cardiac CTs. Some insurance companies require a pre-authorization which may delay scheduling of this test.   For non-scheduling related questions, please  contact the cardiac imaging nurse navigator should you have any questions/concerns: Marchia Bond, Cardiac Imaging Nurse Navigator Gordy Clement, Cardiac Imaging Nurse Navigator [facility] Direct Office Dial: [phone]   For scheduling needs, including cancellations and rescheduling, please call [place], [phone].

## 2021-01-14 ENCOUNTER — Telehealth (HOSPITAL_COMMUNITY): Payer: Self-pay | Admitting: Emergency Medicine

## 2021-01-14 NOTE — Telephone Encounter (Signed)
Attempted to call patient regarding upcoming cardiac CT appointment. °Left message on voicemail with name and callback number °Linville Decarolis RN Navigator Cardiac Imaging °[facility] °[phone] Office °[phone] Cell ° °

## 2021-01-15 ENCOUNTER — Other Ambulatory Visit: Payer: Self-pay

## 2021-01-15 ENCOUNTER — Ambulatory Visit (HOSPITAL_COMMUNITY)
Admission: RE | Admit: 2021-01-15 | Discharge: 2021-01-15 | Disposition: A | Discharge status: Home / Self Care | Payer: BC Managed Care – PPO | Source: Ambulatory Visit | Attending: Cardiology | Admitting: Cardiology

## 2021-01-15 DIAGNOSIS — R072 Precordial pain: Secondary | ICD-10-CM | POA: Diagnosis present

## 2021-01-15 MED ORDER — IOHEXOL 350 MG/ML SOLN
100.0000 mL | Freq: Once | INTRAVENOUS | Status: AC | PRN
  Administered 2021-01-15: 100 mL via INTRAVENOUS

## 2021-01-15 MED ORDER — NITROGLYCERIN 0.4 MG SL SUBL
SUBLINGUAL_TABLET | SUBLINGUAL | Status: AC
  Filled 2021-01-15: qty 2

## 2021-01-15 MED ORDER — NITROGLYCERIN 0.4 MG SL SUBL
0.8000 mg | SUBLINGUAL_TABLET | Freq: Once | SUBLINGUAL | Status: AC
  Administered 2021-01-15: 0.8 mg via SUBLINGUAL

## 2021-01-29 ENCOUNTER — Encounter: Payer: Self-pay | Admitting: Gastroenterology

## 2021-01-29 ENCOUNTER — Ambulatory Visit (INDEPENDENT_AMBULATORY_CARE_PROVIDER_SITE_OTHER): Payer: BC Managed Care – PPO | Admitting: Gastroenterology

## 2021-01-29 VITALS — BP 92/60 | HR 63 | Ht 66.0 in | Wt 133.0 lb

## 2021-01-29 DIAGNOSIS — K5730 Diverticulosis of large intestine without perforation or abscess without bleeding: Secondary | ICD-10-CM

## 2021-01-29 DIAGNOSIS — K625 Hemorrhage of anus and rectum: Secondary | ICD-10-CM

## 2021-01-29 MED ORDER — HYDROCORTISONE (PERIANAL) 2.5 % EX CREA: 1.0000 "application " | TOPICAL_CREAM | Freq: Two times a day (BID) | CUTANEOUS | 1 refills | Status: AC

## 2021-01-29 NOTE — Progress Notes (Signed)
Referring Provider: Janith Lima, MD Primary Care Physician:  Janith Lima, MD  Chief complaint:  Polyps   IMPRESSION:  History of colon polyps    - 5 tubular adenomas on colonoscopy in [place] in 2016    - 3 tubular adenomas on colonoscopy 11/30/20    - surveillance colonoscopy recommended in 3 years Reflux and gastritis    - on PPI therapy    - avoid NSAIDs    - reflux lifestyle modifications [place] perianal lesion seen at time of colonoscopy    - no longer present    - referred to surgery but lesion not identified Intermittent rectal bleeding attributed to internal hemorrhoids Family history of colon cancer (father with colon cancer) Internal hemorrhoids with history of bleeding Left-sided diverticulosis  Reviewed pathology results and surveillance recommendations based on [employer] Guidelines. He was in agreement.  All questions answered to his satisfaction.  PLAN: - High fiber diet recommended. - Please drink at least 1.5-2 liters of water every day. - I recommend adding a daily stool bulking agent with psyllium or methylcellulose. - I have prescribed  Anusol HC 2.5% applied sparingly to your rectum twice daily. - Colonoscopy in 3 years - Call to schedule hemorrhoidal banding with recurrent rectal bleeding - Low threshold to consider genetic testing with findings of additional tubular adenomas  HPI: Louis Cardenas is a 55 y.o. male who returns after screening colonoscopy and upper endoscopy 11/30/20. Although I met him at the time of endoscopy, this is my first office visit with Louis Cardenas. He has anxiety, depression, bipolar disorder, bulimia, sleep apnea does not use cpap, coronary artery disease s/p MI in 2011 s/p stent on ASA and Plavix, kidney stones and colon polyps. S/P appendectomy 1998.  EGD showed mild gastritis and was otherwise normal.   Colonoscopy showed: a pearly perianal lesion of unclear clinical significant, left-sided diverticulosis, a 6-66m  sigmoid polyp, and 2 ascending colon polyps.   Pathology results show: 1. Surgical [P], gastric antrum - ANTRAL MUCOSA WITH HYPEREMIA. - WARTHIN-STARRY NEGATIVE FOR HELICOBACTER PYLORI. - NO INTESTINAL METAPLASIA, DYSPLASIA OR CARCINOMA. 2. Surgical [P], gastric body - gastritis - OXYNTIC MUCOSA WITH HYPEREMIA. - WARTHIN-STARRY NEGATIVE FOR HELICOBACTER PYLORI. - NO INTESTINAL METAPLASIA, DYSPLASIA OR CARCINOMA. 3. Surgical [P], fundus - OXYNTIC MUCOSA WITH HYPEREMIA. - WARTHIN-STARRY NEGATIVE FOR HELICOBACTER PYLORI. - NO INTESTINAL METAPLASIA, DYSPLASIA OR CARCINOMA. 4. Surgical [P], distal esophagus - GASTROESOPHAGEAL MUCOSA WITH MILD INFLAMMATION CONSISTENT WITH REFLUX. - NO INTESTINAL METAPLASIA, DYSPLASIA OR CARCINOMA. 5. Surgical [P], mid & proximal esophagus - UNREMARKABLE SQUAMOUS MUCOSA. - NO EOSINOPHILIC ESOPHAGITIS (LESS THAN 2 PER HIGH POWER FIELD). 6. Surgical [P], colon, ascending, polyp (2) - TUBULAR ADENOMA (TWO). - NO HIGH GRADE DYSPLASIA OR CARCINOMA. 7. Surgical [P], colon, sigmoid colon polyp @ 28 cm, polyp (1) - TUBULAR ADENOMA. - NO HIGH GRADE DYSPLASIA OR CARCINOMA.  He is anxious about the findings of tubular adenomas and presents today to discuss annual colonoscopy.   Has had rectal bleeding once or twice after his colonoscopy. He is overall reassured.   Consult with Dr. GJohney Maine grade 2 internal hemorrhoids; no concerning rectal findings identified. High fiber diet recommended. Consider intervention only if symptoms worsen. No formal follow-up planned.   Most recent CBC 09/22/20 was normal  Past Medical History:  Diagnosis Date   Anxiety    Bipolar 1 disorder ([place]    CAD (coronary artery disease)    Depressed    Eating disorder    bulimia  Family history of colon cancer in father    GERD (gastroesophageal reflux disease)    History of colon polyps    Hypertension    Hypogonadism in male    MI (myocardial infarction) ([place])    Stroke ([place])     Thyroid nodule     Past Surgical History:  Procedure Laterality Date   APPENDECTOMY  1998   COLONOSCOPY     CORONARY ANGIOPLASTY WITH STENT PLACEMENT     ROTATOR CUFF REPAIR Left    with screws   UPPER GASTROINTESTINAL ENDOSCOPY      Prior to Admission medications   Medication Sig Start Date End Date Taking? Authorizing Provider  artificial tears (LACRILUBE) OINT ophthalmic ointment Place into both eyes every 4 (four) hours as needed for dry eyes. 09/22/20   Davonna Belling, MD  aspirin 81 MG chewable tablet Chew by mouth daily.    [provider]  atorvastatin (LIPITOR) 40 MG tablet Take 40 mg by mouth daily.    [provider]  ciclopirox (LOPROX) 0.77 % SUSP Apply topically daily as needed. 11/17/20   [provider]  clonazePAM (KLONOPIN) 0.5 MG tablet Take 0.5 mg by mouth as needed for anxiety.    [provider]  clopidogrel (PLAVIX) 75 MG tablet Take 75 mg by mouth daily.    [provider]  cyclobenzaprine (FLEXERIL) 10 MG tablet Take 1 tablet (10 mg total) by mouth 3 (three) times daily as needed for muscle spasms. This medicine may make you drowsy. Do not take it with clonazepam at the same time. 10/02/19   Masoudi, Elhamalsadat, MD  desonide (DESOWEN) 0.05 % lotion Apply topically daily as needed. 11/17/20   [provider]  diclofenac Sodium (VOLTAREN) 1 % GEL Apply 4 g topically 4 (four) times daily. 10/02/19   Masoudi, Dorthula Rue, MD  hydrOXYzine (ATARAX/VISTARIL) 25 MG tablet Take 25 mg by mouth 2 (two) times daily as needed. Patient not taking: Reported on 01/11/2021 11/12/20   [provider]  lamoTRIgine (LAMICTAL) 200 MG tablet Take 200 mg by mouth daily.    [provider]  lithium 300 MG tablet Take 300 mg by mouth daily.     [provider]  metoprolol tartrate (LOPRESSOR) 50 MG tablet TAKE 1 TABLET 2 HR PRIOR TO CARDIAC PROCEDURE 01/11/21   Minus Breeding, MD  nitroGLYCERIN (NITROSTAT) 0.4  MG SL tablet Place 0.4 mg under the tongue every 5 (five) minutes as needed for chest pain.    [provider]  pantoprazole (PROTONIX) 40 MG tablet Take 40 mg by mouth daily.    [provider]  sildenafil (VIAGRA) 100 MG tablet Take 100 mg by mouth daily as needed for erectile dysfunction.    [provider]  Testosterone 40.5 MG/2.5GM (1.62%) GEL Place onto the skin daily. Apply 1 packet daily    [provider]  TRINTELLIX 10 MG TABS tablet Take 10 mg by mouth daily. 08/27/20   [provider]  varenicline (CHANTIX CONTINUING MONTH PAK) 1 MG tablet Take 1 tablet (1 mg total) by mouth 2 (two) times daily. 07/23/20   Minus Breeding, MD  VRAYLAR 1.5 MG capsule Take 1.5 mg by mouth daily. 11/12/20   [provider]    Current Outpatient Medications  Medication Sig Dispense Refill   artificial tears (LACRILUBE) OINT ophthalmic ointment Place into both eyes every 4 (four) hours as needed for dry eyes. 3.5 g 0   aspirin 81 MG chewable tablet Chew by mouth daily.  atorvastatin (LIPITOR) 40 MG tablet Take 40 mg by mouth daily.     ciclopirox (LOPROX) 0.77 % SUSP Apply topically daily as needed.     clonazePAM (KLONOPIN) 0.5 MG tablet Take 0.5 mg by mouth as needed for anxiety.     clopidogrel (PLAVIX) 75 MG tablet Take 75 mg by mouth daily.     cyclobenzaprine (FLEXERIL) 10 MG tablet Take 1 tablet (10 mg total) by mouth 3 (three) times daily as needed for muscle spasms. This medicine may make you drowsy. Do not take it with clonazepam at the same time. 20 tablet 0   desonide (DESOWEN) 0.05 % lotion Apply topically daily as needed.     diclofenac Sodium (VOLTAREN) 1 % GEL Apply 4 g topically 4 (four) times daily. 100 g 0   hydrocortisone (ANUSOL-HC) 2.5 % rectal cream Place 1 application rectally 2 (two) times daily. 30 g 1   hydrOXYzine (ATARAX/VISTARIL) 25 MG tablet Take 25 mg by mouth 2 (two) times daily as needed.     lamoTRIgine (LAMICTAL)  200 MG tablet Take 200 mg by mouth daily.     lithium 300 MG tablet Take 300 mg by mouth daily.      metoprolol tartrate (LOPRESSOR) 50 MG tablet TAKE 1 TABLET 2 HR PRIOR TO CARDIAC PROCEDURE 1 tablet 0   nitroGLYCERIN (NITROSTAT) 0.4 MG SL tablet Place 0.4 mg under the tongue every 5 (five) minutes as needed for chest pain.     pantoprazole (PROTONIX) 40 MG tablet Take 40 mg by mouth daily.     sildenafil (VIAGRA) 100 MG tablet Take 100 mg by mouth daily as needed for erectile dysfunction.     Testosterone 40.5 MG/2.5GM (1.62%) GEL Place onto the skin daily. Apply 1 packet daily     TRINTELLIX 10 MG TABS tablet Take 10 mg by mouth daily.     varenicline (CHANTIX CONTINUING MONTH PAK) 1 MG tablet Take 1 tablet (1 mg total) by mouth 2 (two) times daily. 60 tablet 2   VRAYLAR 1.5 MG capsule Take 1.5 mg by mouth daily.     No current facility-administered medications for this visit.    Allergies as of 01/29/2021   (No Known Allergies)    Family History  Problem Relation Age of Onset   Thyroid disease Mother    Depression Mother    Hypertension Mother    Colon cancer Father    Liver cancer Father    Esophageal cancer Neg Hx    Rectal cancer Neg Hx    Stomach cancer Neg Hx     Social History   Socioeconomic History   Marital status: Married    Spouse name: Not on file   Number of children: Not on file   Years of education: Not on file   Highest education level: Not on file  Occupational History   Not on file  Tobacco Use   Smoking status: Every Day    Packs/day: 1.00    Years: 25.00    Pack years: 25.00    Types: Cigarettes   Smokeless tobacco: Never   Tobacco comments:    .5 to 1 packs per day   Vaping Use   Vaping Use: Never used  Substance and Sexual Activity   Alcohol use: Yes    Alcohol/week: 2.0 standard drinks    Types: 2 Glasses of wine per week    Comment: socially   Drug use: Yes    Types: Marijuana    Comment: occassionaly  Sexual activity: Not  Currently    Partners: Male  Other Topics Concern   Not on file  Social History Narrative   Not on file   Social Determinants of Health   Financial Resource Strain: Not on file  Food Insecurity: Not on file  Transportation Needs: Not on file  Physical Activity: Not on file  Stress: Not on file  Social Connections: Not on file  Intimate Partner Violence: Not on file    Review of Systems: 12 system ROS is negative except as noted above.   Physical Exam: @VSRANGES @   General:   Alert,  well-nourished, pleasant and cooperative in NAD Head:  Normocephalic and atraumatic. Eyes:  Sclera clear, no icterus.   Conjunctiva pink. Ears:  Normal auditory acuity. Nose:  No deformity, discharge,  or lesions. Mouth:  No deformity or lesions.   Neck:  Supple; no masses or thyromegaly. Lungs:  Clear throughout to auscultation.   No wheezes. Heart:  Regular rate and rhythm; no murmurs. Abdomen:  Soft,nontender, nondistended, normal bowel sounds, no rebound or guarding. No hepatosplenomegaly.   Rectal:  Deferred  Msk:  Symmetrical. No boney deformities LAD: No inguinal or umbilical LAD Extremities:  No clubbing or edema. Neurologic:  Alert and  oriented x4;  grossly nonfocal Skin:  Intact without significant lesions or rashes. Psych:  Alert and cooperative. Normal mood and affect.    Ulrich Soules L. Tarri Glenn, MD, MPH 02/04/2021, 6:19 AM

## 2021-01-29 NOTE — Patient Instructions (Addendum)
High fiber diet recommended.  Please drink at least 1.5-2 liters of water every day.  I recommend adding a daily stool bulking agent with psyllium or methylcellulose.  I have prescribed  Anusol HC 2.5% applied sparingly to your rectum twice daily.   Sitz Baths may provide some additional relief.  If you would like more information, [email or web address] and [email or web address] have good information about hemorrhoids.   Please call me if you would like to consider banding of your hemorrhoids in the future. We should be able to schedule that appointment at your convenience.   Otherwise, I look forward to seeing you for your colonoscopy in 3 years.

## 2021-02-04 ENCOUNTER — Encounter: Payer: Self-pay | Admitting: Gastroenterology

## 2021-08-03 ENCOUNTER — Ambulatory Visit: Payer: BC Managed Care – PPO | Admitting: Internal Medicine

## 2021-08-16 ENCOUNTER — Encounter: Payer: Self-pay | Admitting: Internal Medicine

## 2021-08-16 ENCOUNTER — Ambulatory Visit: Payer: BC Managed Care – PPO | Admitting: Internal Medicine

## 2021-08-16 VITALS — BP 120/68 | HR 64 | Temp 98.0°F | Ht 66.0 in | Wt 139.0 lb

## 2021-08-16 DIAGNOSIS — E785 Hyperlipidemia, unspecified: Secondary | ICD-10-CM

## 2021-08-16 DIAGNOSIS — R413 Other amnesia: Secondary | ICD-10-CM | POA: Insufficient documentation

## 2021-08-16 DIAGNOSIS — E291 Testicular hypofunction: Secondary | ICD-10-CM | POA: Diagnosis not present

## 2021-08-16 DIAGNOSIS — J3089 Other allergic rhinitis: Secondary | ICD-10-CM | POA: Diagnosis not present

## 2021-08-16 DIAGNOSIS — Z0001 Encounter for general adult medical examination with abnormal findings: Secondary | ICD-10-CM

## 2021-08-16 DIAGNOSIS — R040 Epistaxis: Secondary | ICD-10-CM

## 2021-08-16 LAB — HEPATIC FUNCTION PANEL
ALT: 23 U/L (ref 0–53)
AST: 20 U/L (ref 0–37)
Albumin: 4.8 g/dL (ref 3.5–5.2)
Alkaline Phosphatase: 100 U/L (ref 39–117)
Bilirubin, Direct: 0.1 mg/dL (ref 0.0–0.3)
Total Bilirubin: 0.4 mg/dL (ref 0.2–1.2)
Total Protein: 7.5 g/dL (ref 6.0–8.3)

## 2021-08-16 LAB — CBC WITH DIFFERENTIAL/PLATELET
Basophils Absolute: 0.2 10*3/uL — ABNORMAL HIGH (ref 0.0–0.1)
Basophils Relative: 2.4 % (ref 0.0–3.0)
Eosinophils Absolute: 0.2 10*3/uL (ref 0.0–0.7)
Eosinophils Relative: 2.5 % (ref 0.0–5.0)
HCT: 43.5 % (ref 39.0–52.0)
Hemoglobin: 14.2 g/dL (ref 13.0–17.0)
Lymphocytes Relative: 21 % (ref 12.0–46.0)
Lymphs Abs: 1.3 10*3/uL (ref 0.7–4.0)
MCHC: 32.7 g/dL (ref 30.0–36.0)
MCV: 86.2 fl (ref 78.0–100.0)
Monocytes Absolute: 0.4 10*3/uL (ref 0.1–1.0)
Monocytes Relative: 6.9 % (ref 3.0–12.0)
Neutro Abs: 4.2 10*3/uL (ref 1.4–7.7)
Neutrophils Relative %: 67.2 % (ref 43.0–77.0)
Platelets: 347 10*3/uL (ref 150.0–400.0)
RBC: 5.04 Mil/uL (ref 4.22–5.81)
RDW: 16.9 % — ABNORMAL HIGH (ref 11.5–15.5)
WBC: 6.3 10*3/uL (ref 4.0–10.5)

## 2021-08-16 LAB — BASIC METABOLIC PANEL WITH GFR
BUN: 11 mg/dL (ref 6–23)
CO2: 25 meq/L (ref 19–32)
Calcium: 10.2 mg/dL (ref 8.4–10.5)
Chloride: 105 meq/L (ref 96–112)
Creatinine, Ser: 1.27 mg/dL (ref 0.40–1.50)
GFR: 64.25 mL/min
Glucose, Bld: 90 mg/dL (ref 70–99)
Potassium: 4.1 meq/L (ref 3.5–5.1)
Sodium: 139 meq/L (ref 135–145)

## 2021-08-16 LAB — LIPID PANEL
Cholesterol: 119 mg/dL (ref 0–200)
HDL: 48.6 mg/dL (ref >39.00)
LDL Cholesterol: 56 mg/dL (ref 0–99)
NonHDL: 70.24
Total CHOL/HDL Ratio: 2
Triglycerides: 71 mg/dL (ref 0.0–149.0)
VLDL: 14.2 mg/dL (ref 0.0–40.0)

## 2021-08-16 LAB — PROTIME-INR
INR: 1 ratio (ref 0.8–1.0)
Prothrombin Time: 10.7 s (ref 9.6–13.1)

## 2021-08-16 LAB — APTT: aPTT: 32 s (ref 23.4–32.7)

## 2021-08-16 MED ORDER — LEVOCETIRIZINE DIHYDROCHLORIDE 5 MG PO TABS: 5.0000 mg | ORAL_TABLET | Freq: Every evening | ORAL | 1 refills | Status: DC

## 2021-08-16 NOTE — Patient Instructions (Signed)
Health Maintenance, Male Adopting a healthy lifestyle and getting preventive care are important in promoting health and wellness. Ask your health care provider about: The right schedule for you to have regular tests and exams. Things you can do on your own to prevent diseases and keep yourself healthy. What should I know about diet, weight, and exercise? Eat a healthy diet  Eat a diet that includes plenty of vegetables, fruits, low-fat dairy products, and lean protein. Do not eat a lot of foods that are high in solid fats, added sugars, or sodium. Maintain a healthy weight Body mass index (BMI) is a measurement that can be used to identify possible weight problems. It estimates body fat based on height and weight. Your health care provider can help determine your BMI and help you achieve or maintain a healthy weight. Get regular exercise Get regular exercise. This is one of the most important things you can do for your health. Most adults should: Exercise for at least 150 minutes each week. The exercise should increase your heart rate and make you sweat (moderate-intensity exercise). Do strengthening exercises at least twice a week. This is in addition to the moderate-intensity exercise. Spend less time sitting. Even light physical activity can be beneficial. Watch cholesterol and blood lipids Have your blood tested for lipids and cholesterol at 54 years of age, then have this test every 5 years. You may need to have your cholesterol levels checked more often if: Your lipid or cholesterol levels are high. You are older than 54 years of age. You are at high risk for heart disease. What should I know about cancer screening? Many types of cancers can be detected early and may often be prevented. Depending on your health history and family history, you may need to have cancer screening at various ages. This may include screening for: Colorectal cancer. Prostate cancer. Skin cancer. Lung  cancer. What should I know about heart disease, diabetes, and high blood pressure? Blood pressure and heart disease High blood pressure causes heart disease and increases the risk of stroke. This is more likely to develop in people who have high blood pressure readings or are overweight. Talk with your health care provider about your target blood pressure readings. Have your blood pressure checked: Every 3-5 years if you are 18-39 years of age. Every year if you are 40 years old or older. If you are between the ages of 65 and 75 and are a current or former smoker, ask your health care provider if you should have a one-time screening for abdominal aortic aneurysm (AAA). Diabetes Have regular diabetes screenings. This checks your fasting blood sugar level. Have the screening done: Once every three years after age 45 if you are at a normal weight and have a low risk for diabetes. More often and at a younger age if you are overweight or have a high risk for diabetes. What should I know about preventing infection? Hepatitis B If you have a higher risk for hepatitis B, you should be screened for this virus. Talk with your health care provider to find out if you are at risk for hepatitis B infection. Hepatitis C Blood testing is recommended for: Everyone born from 1945 through 1965. Anyone with known risk factors for hepatitis C. Sexually transmitted infections (STIs) You should be screened each year for STIs, including gonorrhea and chlamydia, if: You are sexually active and are younger than 54 years of age. You are older than 54 years of age and your   health care provider tells you that you are at risk for this type of infection. Your sexual activity has changed since you were last screened, and you are at increased risk for chlamydia or gonorrhea. Ask your health care provider if you are at risk. Ask your health care provider about whether you are at high risk for HIV. Your health care provider  may recommend a prescription medicine to help prevent HIV infection. If you choose to take medicine to prevent HIV, you should first get tested for HIV. You should then be tested every 3 months for as long as you are taking the medicine. Follow these instructions at home: Alcohol use Do not drink alcohol if your health care provider tells you not to drink. If you drink alcohol: Limit how much you have to 0-2 drinks a day. Know how much alcohol is in your drink. In the [place] one drink equals one 12 oz bottle of beer (355 mL), one 5 oz glass of wine (148 mL), or one 1 oz glass of hard liquor (44 mL). Lifestyle Do not use any products that contain nicotine or tobacco. These products include cigarettes, chewing tobacco, and vaping devices, such as e-cigarettes. If you need help quitting, ask your health care provider. Do not use street drugs. Do not share needles. Ask your health care provider for help if you need support or information about quitting drugs. General instructions Schedule regular health, dental, and eye exams. Stay current with your vaccines. Tell your health care provider if: You often feel depressed. You have ever been abused or do not feel safe at home. Summary Adopting a healthy lifestyle and getting preventive care are important in promoting health and wellness. Follow your health care provider's instructions about healthy diet, exercising, and getting tested or screened for diseases. Follow your health care provider's instructions on monitoring your cholesterol and blood pressure. This information is not intended to replace advice given to you by your health care provider. Make sure you discuss any questions you have with your health care provider. Document Revised: 08/31/2020 Document Reviewed: 08/31/2020 Elsevier Patient Education  2023 [employer] Inc[employer]  

## 2021-08-16 NOTE — Progress Notes (Signed)
? ?Subjective:  ?Patient ID: Louis Cardenas, male    DOB: 01/25/1966  Age: 56 y.o. MRN: [id number] ? ?CC: Annual Exam and Allergic Rhinitis  ? ? ?HPI ?Louis Cardenas presents for a CPX and f/up -  ? ?He complains of a 32-monthhistory of nasal congestion, burning sensation in his sinuses, intermittent nosebleeds, and clear nasal phlegm.  He denies facial pain, headache, fever, chills, sore throat, or rash.  He complains of erectile dysfunction but has good libido.  He wants to check his testosterone level. ? ? ?Outpatient Medications Prior to Visit  ?Medication Sig Dispense Refill  ? artificial tears (LACRILUBE) OINT ophthalmic ointment Place into both eyes every 4 (four) hours as needed for dry eyes. 3.5 g 0  ? aspirin 81 MG chewable tablet Chew by mouth daily.    ? atorvastatin (LIPITOR) 40 MG tablet Take 40 mg by mouth daily.    ? ciclopirox (LOPROX) 0.77 % SUSP Apply topically daily as needed.    ? clonazePAM (KLONOPIN) 0.5 MG tablet Take 0.5 mg by mouth as needed for anxiety.    ? clopidogrel (PLAVIX) 75 MG tablet Take 75 mg by mouth daily.    ? cyclobenzaprine (FLEXERIL) 10 MG tablet Take 1 tablet (10 mg total) by mouth 3 (three) times daily as needed for muscle spasms. This medicine may make you drowsy. Do not take it with clonazepam at the same time. 20 tablet 0  ? desonide (DESOWEN) 0.05 % lotion Apply topically daily as needed.    ? diclofenac Sodium (VOLTAREN) 1 % GEL Apply 4 g topically 4 (four) times daily. 100 g 0  ? hydrocortisone (ANUSOL-HC) 2.5 % rectal cream Place 1 application rectally 2 (two) times daily. 30 g 1  ? hydrOXYzine (ATARAX/VISTARIL) 25 MG tablet Take 25 mg by mouth 2 (two) times daily as needed.    ? lamoTRIgine (LAMICTAL) 200 MG tablet Take 200 mg by mouth daily.    ? lithium 300 MG tablet Take 300 mg by mouth daily.     ? metoprolol tartrate (LOPRESSOR) 50 MG tablet TAKE 1 TABLET 2 HR PRIOR TO CARDIAC PROCEDURE 1 tablet 0  ? nitroGLYCERIN (NITROSTAT) 0.4 MG SL tablet Place 0.4 mg  under the tongue every 5 (five) minutes as needed for chest pain.    ? pantoprazole (PROTONIX) 40 MG tablet Take 40 mg by mouth daily.    ? sildenafil (VIAGRA) 100 MG tablet Take 100 mg by mouth daily as needed for erectile dysfunction.    ? Testosterone 40.5 MG/2.5GM (1.62%) GEL Place onto the skin daily. Apply 1 packet daily    ? TRINTELLIX 10 MG TABS tablet Take 10 mg by mouth daily.    ? varenicline (CHANTIX CONTINUING MONTH PAK) 1 MG tablet Take 1 tablet (1 mg total) by mouth 2 (two) times daily. 60 tablet 2  ? VRAYLAR 1.5 MG capsule Take 1.5 mg by mouth daily.    ? ?No facility-administered medications prior to visit.  ? ? ?ROS ?Review of Systems  ?Constitutional:  Positive for fatigue. Negative for chills and unexpected weight change.  ?HENT:  Positive for congestion, nosebleeds, postnasal drip and rhinorrhea. Negative for facial swelling, sinus pressure, sinus pain and sore throat.   ?Respiratory: Negative.  Negative for cough, chest tightness, shortness of breath and wheezing.   ?Cardiovascular: Negative.  Negative for chest pain, palpitations and leg swelling.  ?Gastrointestinal:  Negative for abdominal pain, blood in stool, constipation, diarrhea, nausea and rectal pain.  ?Genitourinary: Negative.  Negative for  difficulty urinating.  ?Musculoskeletal: Negative.   ?Skin: Negative.  Negative for rash.  ?Neurological: Negative.   ?Hematological:  Negative for adenopathy. Bruises/bleeds easily.  ?Psychiatric/Behavioral:  Positive for decreased concentration and sleep disturbance. Negative for dysphoric mood and suicidal ideas.   ?     "Memory lapses"  ? ?Objective:  ?BP 120/68 (BP Location: Left Arm, Patient Position: Sitting, Cuff Size: Large)   Pulse 64   Temp 98 ?F (36.7 ?C) (Oral)   Ht '5\' 6"'$  (1.676 m)   Wt 139 lb (63 kg)   SpO2 98%   BMI 22.44 kg/m?  ? ?BP Readings from Last 3 Encounters:  ?08/16/21 120/68  ?01/29/21 92/60  ?01/15/21 107/67  ? ? ?Wt Readings from Last 3 Encounters:  ?08/16/21 139 lb  (63 kg)  ?01/29/21 133 lb (60.3 kg)  ?01/11/21 133 lb 12.8 oz (60.7 kg)  ? ? ?Physical Exam ?Vitals reviewed.  ?Constitutional:   ?   Appearance: Normal appearance.  ?HENT:  ?   Nose: Nose normal. No mucosal edema, congestion or rhinorrhea.  ?   Right Nostril: No foreign body, epistaxis or septal hematoma.  ?   Left Nostril: No foreign body, epistaxis or septal hematoma.  ?   Mouth/Throat:  ?   Mouth: Mucous membranes are moist.  ?Eyes:  ?   General: No scleral icterus. ?   Conjunctiva/sclera: Conjunctivae normal.  ?Cardiovascular:  ?   Rate and Rhythm: Normal rate and regular rhythm.  ?   Heart sounds: No murmur heard. ?Pulmonary:  ?   Effort: Pulmonary effort is normal.  ?   Breath sounds: No stridor. No wheezing, rhonchi or rales.  ?Abdominal:  ?   General: Abdomen is flat.  ?   Palpations: There is no mass.  ?   Tenderness: There is no abdominal tenderness. There is no guarding or rebound.  ?   Hernia: No hernia is present.  ?Musculoskeletal:     ?   General: Normal range of motion.  ?   Cervical back: Neck supple.  ?   Right lower leg: No edema.  ?   Left lower leg: No edema.  ?Lymphadenopathy:  ?   Cervical: No cervical adenopathy.  ?Skin: ?   General: Skin is warm and dry.  ?Neurological:  ?   Mental Status: He is alert.  ? ? ?Lab Results  ?Component Value Date  ? WBC 6.3 08/16/2021  ? HGB 14.2 08/16/2021  ? HCT 43.5 08/16/2021  ? PLT 347.0 08/16/2021  ? GLUCOSE 90 08/16/2021  ? CHOL 119 08/16/2021  ? TRIG 71.0 08/16/2021  ? HDL 48.60 08/16/2021  ? [place] 56 08/16/2021  ? ALT 23 08/16/2021  ? AST 20 08/16/2021  ? NA 139 08/16/2021  ? K 4.1 08/16/2021  ? CL 105 08/16/2021  ? CREATININE 1.27 08/16/2021  ? BUN 11 08/16/2021  ? CO2 25 08/16/2021  ? TSH 0.63 07/08/2020  ? PSA 0.48 07/08/2020  ? INR 1.0 08/16/2021  ? ? ?CT CORONARY MORPH W/CTA COR W/SCORE W/CA W/CM &/OR WO/CM ? 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  EXAM: Cardiac/Coronary  CTA TECHNIQUE: The patient was scanned on a [employer]. FINDINGS: A 100 kV prospective scan was triggered in the descending thoracic aorta at 111 HU's. Axial non-contrast 3 mm slices were carried out through the heart. The data set was analyzed on a dedicated work station and scored  using the Agatson method. Gantry rotation speed was 250 msecs and collimation was .6 mm. No beta blockade and 0.8 mg of sl NTG was given. The 3D data set was reconstructed in 5% intervals of the 67-82 % of the R-R cycle. Diastolic phases were analyzed on a dedicated work station using MPR, MIP and VRT modes. The patient received 80 cc of contrast. Aorta:  Normal size.  (30 mm) no calcifications.  No dissection. Aortic Valve: No calcifications. Coronary Arteries:  Normal coronary origin.  Right dominance. RCA is a large dominant artery that gives rise to PDA and PLA. There is no plaque. Left main is a large artery that gives rise to LAD and LCX arteries. LAD is a large vessel that has a mid LAD stent (25 mm). Stent is widely patent. LCX is a non-dominant artery that gives rise to one large OM1 branch. There is a 25-49% mid LCX plaque at bifurcation of first OM. Other findings: Normal pulmonary vein drainage into the left atrium. Normal left atrial appendage without a thrombus. Normal size of the pulmonary artery. Please see radiology report for non cardiac findings. IMPRESSION: 1. Coronary calcium score is unable to be calculated due to LAD stent. 2. Normal coronary origin with right dominance. 3. Mid LAD 66m stent appears widely patent. 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 Electronically Signed   By: MCandee FurbishM.D.   On: 01/15/2021 16:48  ? ?Result Date: 01/15/2021 ?EXAM: OVER-READ INTERPRETATION  CT CHEST The following report is an over-read performed by radiologist Dr. [facility]let[facility]of [facility] [place] 01/15/2021. This over-read does  not include interpretation of cardiac or coronary anatomy or pathology. The coronary CTA interpretation by the cardiologist is attached. COMPARISON:  CT of the chest on 08/03/2020 FINDINGS: Vascular: No si

## 2021-08-17 ENCOUNTER — Ambulatory Visit: Payer: BC Managed Care – PPO | Admitting: Internal Medicine

## 2021-08-17 ENCOUNTER — Encounter: Payer: Self-pay | Admitting: Internal Medicine

## 2021-08-17 LAB — TESTOSTERONE TOTAL,FREE,BIO, MALES
Albumin: 4.9 g/dL (ref 3.6–5.1)
Sex Hormone Binding: 48 nmol/L (ref 10–50)
Testosterone, Bioavailable: 135.9 ng/dL (ref 110.0–575.0)
Testosterone, Free: 61 pg/mL (ref 46.0–224.0)
Testosterone: 619 ng/dL (ref 250–827)

## 2021-08-17 LAB — HIV ANTIBODY (ROUTINE TESTING W REFLEX): HIV 1&2 Ab, 4th Generation: NONREACTIVE

## 2021-09-08 ENCOUNTER — Encounter: Payer: Self-pay | Admitting: Internal Medicine

## 2021-09-08 ENCOUNTER — Ambulatory Visit: Payer: BC Managed Care – PPO | Admitting: Internal Medicine

## 2021-09-08 VITALS — BP 118/72 | HR 60 | Temp 98.1°F | Ht 66.0 in | Wt 141.0 lb

## 2021-09-08 DIAGNOSIS — I2510 Atherosclerotic heart disease of native coronary artery without angina pectoris: Secondary | ICD-10-CM

## 2021-09-08 DIAGNOSIS — K2101 Gastro-esophageal reflux disease with esophagitis, with bleeding: Secondary | ICD-10-CM | POA: Diagnosis not present

## 2021-09-08 DIAGNOSIS — E291 Testicular hypofunction: Secondary | ICD-10-CM | POA: Diagnosis not present

## 2021-09-08 DIAGNOSIS — N1831 Chronic kidney disease, stage 3a: Secondary | ICD-10-CM

## 2021-09-08 DIAGNOSIS — K920 Hematemesis: Secondary | ICD-10-CM | POA: Diagnosis not present

## 2021-09-08 DIAGNOSIS — E785 Hyperlipidemia, unspecified: Secondary | ICD-10-CM

## 2021-09-08 LAB — CBC WITH DIFFERENTIAL/PLATELET
Basophils Absolute: 0.1 10*3/uL (ref 0.0–0.1)
Basophils Relative: 1.5 % (ref 0.0–3.0)
Eosinophils Absolute: 0.2 10*3/uL (ref 0.0–0.7)
Eosinophils Relative: 2.4 % (ref 0.0–5.0)
HCT: 44.1 % (ref 39.0–52.0)
Hemoglobin: 14.3 g/dL (ref 13.0–17.0)
Lymphocytes Relative: 20.8 % (ref 12.0–46.0)
Lymphs Abs: 1.7 10*3/uL (ref 0.7–4.0)
MCHC: 32.4 g/dL (ref 30.0–36.0)
MCV: 86.3 fl (ref 78.0–100.0)
Monocytes Absolute: 0.6 10*3/uL (ref 0.1–1.0)
Monocytes Relative: 7 % (ref 3.0–12.0)
Neutro Abs: 5.6 10*3/uL (ref 1.4–7.7)
Neutrophils Relative %: 68.3 % (ref 43.0–77.0)
Platelets: 303 10*3/uL (ref 150.0–400.0)
RBC: 5.11 Mil/uL (ref 4.22–5.81)
RDW: 16.5 % — ABNORMAL HIGH (ref 11.5–15.5)
WBC: 8.2 10*3/uL (ref 4.0–10.5)

## 2021-09-08 LAB — BASIC METABOLIC PANEL
BUN: 19 mg/dL (ref 6–23)
CO2: 27 mEq/L (ref 19–32)
Calcium: 10.4 mg/dL (ref 8.4–10.5)
Chloride: 106 mEq/L (ref 96–112)
Creatinine, Ser: 1.42 mg/dL (ref 0.40–1.50)
GFR: 56.17 mL/min — ABNORMAL LOW (ref >60.00)
Glucose, Bld: 93 mg/dL (ref 70–99)
Potassium: 4.7 mEq/L (ref 3.5–5.1)
Sodium: 139 mEq/L (ref 135–145)

## 2021-09-08 MED ORDER — ATORVASTATIN CALCIUM 40 MG PO TABS: 40.0000 mg | ORAL_TABLET | Freq: Every day | ORAL | 1 refills | Status: DC

## 2021-09-08 MED ORDER — PANTOPRAZOLE SODIUM 40 MG PO TBEC: 40.0000 mg | DELAYED_RELEASE_TABLET | Freq: Two times a day (BID) | ORAL | 1 refills | Status: DC

## 2021-09-08 MED ORDER — XYOSTED 50 MG/0.5ML ~~LOC~~ SOAJ: 1.0000 | SUBCUTANEOUS | 0 refills | Status: DC

## 2021-09-08 NOTE — Patient Instructions (Signed)
Hematemesis Hematemesis is when you vomit blood. It is a sign of bleeding in the upper GI tract (gastrointestinal tract). The upper GI tract includes the mouth, throat, esophagus, stomach, and the upper part of the small intestine (duodenum). Hematemesis is usually caused by bleeding in the esophagus or stomach. You may suddenly vomit bright red blood. Or the blood may look like coffee grounds. You may also have other symptoms, such as: Stomach pain. Heartburn. Stool (feces) that looks black and tarry. Follow these instructions at home:  Take over-the-counter and prescription medicines only as told by your health care provider. Do not take NSAIDs, including aspirin and ibuprofen, unless your health care provider approves. These medicines can increase bleeding. Rest as needed. Drink enough fluids to keep your urine pale yellow. Take small sips of fluid at a time. Do not drink alcohol. Do not use any products that contain nicotine or tobacco. These products include cigarettes, chewing tobacco, and vaping devices, such as e-cigarettes. If you need help quitting, ask your health care provider. Keep all follow-up visits. This is important. Contact a health care provider if: You have more blood in your vomit. Your vomiting of blood begins again after it has stopped. You have persistent stomach pain. You have nausea, indigestion, or heartburn. Get help right away if: You faint. You feel weak or dizzy. You are urinating less than normal or not at all. You vomit up: Large amounts of blood or dark material that may look like coffee grounds. Bright red blood. You have any of the following: Persistent vomiting. A rapid heartbeat. Blood in your stool. Chest pain. Difficulty breathing. These symptoms may be an emergency. Get help right away. Call 911. Do not wait to see if the symptoms will go away. Do not drive yourself to the hospital. Summary Hematemesis is when you vomit blood. It is a  sign of bleeding in the upper GI tract (gastrointestinal tract). Hematemesis is usually caused by bleeding in the esophagus or stomach. Do not take NSAIDs (including aspirin and ibuprofen), drink alcohol, or use tobacco products. Take over-the-counter and prescription medicines only as told by your health care provider. This information is not intended to replace advice given to you by your health care provider. Make sure you discuss any questions you have with your health care provider. Document Revised: 11/18/2020 Document Reviewed: 11/18/2020 Elsevier Patient Education  2023 [employer]  

## 2021-09-08 NOTE — Progress Notes (Signed)
Subjective:  Patient ID: Louis Cardenas, male    DOB: [birth year]  Age: 56 y.o. MRN: [id number]  CC: Gastroesophageal Reflux   HPI [name] presents for f/up -  He has chronic nausea and vomiting but tells me that a week ago he had an intractable episode of nausea and vomiting.  He has Zofran but did not take it.  Eventually his vomitus turned bloody.  He did not see coffee ground emesis.  That lasted for about a day and is since resolved.  He denies odynophagia, dysphagia, chest pain, shortness of breath, abdominal pain, melena, or bright red blood per rectum.  He did not want to get his shingles vaccine today.  Outpatient Medications Prior to Visit  Medication Sig Dispense Refill   artificial tears (LACRILUBE) OINT ophthalmic ointment Place into both eyes every 4 (four) hours as needed for dry eyes. 3.5 g 0   aspirin 81 MG chewable tablet Chew by mouth daily.     ciclopirox (LOPROX) 0.77 % SUSP Apply topically daily as needed.     clonazePAM (KLONOPIN) 0.5 MG tablet Take 0.5 mg by mouth as needed for anxiety.     clopidogrel (PLAVIX) 75 MG tablet Take 75 mg by mouth daily.     cyclobenzaprine (FLEXERIL) 10 MG tablet Take 1 tablet (10 mg total) by mouth 3 (three) times daily as needed for muscle spasms. This medicine may make you drowsy. Do not take it with clonazepam at the same time. 20 tablet 0   desonide (DESOWEN) 0.05 % lotion Apply topically daily as needed.     diclofenac Sodium (VOLTAREN) 1 % GEL Apply 4 g topically 4 (four) times daily. 100 g 0   hydrocortisone (ANUSOL-HC) 2.5 % rectal cream Place 1 application rectally 2 (two) times daily. 30 g 1   hydrOXYzine (ATARAX/VISTARIL) 25 MG tablet Take 25 mg by mouth 2 (two) times daily as needed.     lamoTRIgine (LAMICTAL) 200 MG tablet Take 200 mg by mouth daily.     levocetirizine (XYZAL) 5 MG tablet Take 1 tablet (5 mg total) by mouth every evening. 90 tablet 1   lithium 300 MG tablet Take 300 mg by mouth daily.       metoprolol tartrate (LOPRESSOR) 50 MG tablet TAKE 1 TABLET 2 HR PRIOR TO CARDIAC PROCEDURE 1 tablet 0   nitroGLYCERIN (NITROSTAT) 0.4 MG SL tablet Place 0.4 mg under the tongue every 5 (five) minutes as needed for chest pain.     sildenafil (VIAGRA) 100 MG tablet Take 100 mg by mouth daily as needed for erectile dysfunction.     TRINTELLIX 10 MG TABS tablet Take 10 mg by mouth daily.     VRAYLAR 1.5 MG capsule Take 1.5 mg by mouth daily.     atorvastatin (LIPITOR) 40 MG tablet Take 40 mg by mouth daily.     pantoprazole (PROTONIX) 40 MG tablet Take 40 mg by mouth daily.     Testosterone 40.5 MG/2.5GM (1.62%) GEL Place onto the skin daily. Apply 1 packet daily     varenicline (CHANTIX CONTINUING MONTH PAK) 1 MG tablet Take 1 tablet (1 mg total) by mouth 2 (two) times daily. 60 tablet 2   No facility-administered medications prior to visit.    ROS Review of Systems  Constitutional:  Negative for chills, diaphoresis, fatigue and fever.  HENT: Negative.    Eyes: Negative.   Respiratory:  Negative for cough, chest tightness, shortness of breath and wheezing.   Cardiovascular:  Negative for  chest pain, palpitations and leg swelling.  Gastrointestinal:  Positive for nausea and vomiting. Negative for abdominal pain, blood in stool, constipation and diarrhea.  Endocrine: Negative.   Genitourinary: Negative.  Negative for difficulty urinating.       ++ED and low libido  Musculoskeletal: Negative.   Skin: Negative.   Neurological:  Negative for dizziness, weakness, light-headedness and headaches.  Hematological:  Negative for adenopathy. Does not bruise/bleed easily.  Psychiatric/Behavioral: Negative.     Objective:  BP 118/72 (BP Location: Left Arm, Patient Position: Sitting, Cuff Size: Large)   Pulse 60   Temp 98.1 F (36.7 C) (Oral)   Ht '5\' 6"'$  (1.676 m)   Wt 141 lb (64 kg)   SpO2 97%   BMI 22.76 kg/m   BP Readings from Last 3 Encounters:  09/08/21 118/72  08/16/21 120/68  01/29/21  92/60    Wt Readings from Last 3 Encounters:  09/08/21 141 lb (64 kg)  08/16/21 139 lb (63 kg)  01/29/21 133 lb (60.3 kg)    Physical Exam Vitals reviewed.  HENT:     Nose: Nose normal.     Mouth/Throat:     Mouth: Mucous membranes are moist.  Eyes:     General: No scleral icterus.    Conjunctiva/sclera: Conjunctivae normal.  Cardiovascular:     Rate and Rhythm: Normal rate and regular rhythm.     Heart sounds: No murmur heard. Pulmonary:     Effort: Pulmonary effort is normal.     Breath sounds: No stridor. No wheezing, rhonchi or rales.  Abdominal:     General: Abdomen is flat.     Palpations: There is no mass.     Tenderness: There is no abdominal tenderness. There is no guarding or rebound.     Hernia: No hernia is present.  Musculoskeletal:        General: Normal range of motion.     Cervical back: Neck supple.     Right lower leg: No edema.     Left lower leg: No edema.  Lymphadenopathy:     Cervical: No cervical adenopathy.  Skin:    General: Skin is warm and dry.     Coloration: Skin is not pale.  Neurological:     General: No focal deficit present.     Mental Status: He is alert. Mental status is at baseline.  Psychiatric:        Mood and Affect: Mood normal.        Behavior: Behavior normal.    Lab Results  Component Value Date   WBC 8.2 09/08/2021   HGB 14.3 09/08/2021   HCT 44.1 09/08/2021   PLT 303.0 09/08/2021   GLUCOSE 93 09/08/2021   CHOL 119 08/16/2021   TRIG 71.0 08/16/2021   HDL 48.60 08/16/2021   LDLCALC 56 08/16/2021   ALT 23 08/16/2021   AST 20 08/16/2021   NA 139 09/08/2021   K 4.7 09/08/2021   CL 106 09/08/2021   CREATININE 1.42 09/08/2021   BUN 19 09/08/2021   CO2 27 09/08/2021   TSH 0.63 07/08/2020   PSA 0.48 07/08/2020   INR 1.0 08/16/2021    CT CORONARY MORPH W/CTA COR W/SCORE W/CA W/CM &/OR WO/CM  Addendum Date: 01/15/2021   ADDENDUM REPORT: 01/15/2021 16:48 CLINICAL DATA:  56 year old with chest pain, prior MI 2011  with LAD stent. EXAM: Cardiac/Coronary  CTA TECHNIQUE: The patient was scanned on a [employer]. FINDINGS: A 100 kV prospective scan was triggered  in the descending thoracic aorta at 111 HU's. Axial non-contrast 3 mm slices were carried out through the heart. The data set was analyzed on a dedicated work station and scored using the [place]. Gantry rotation speed was 250 msecs and collimation was .6 mm. No beta blockade and 0.8 mg of sl NTG was given. The 3D data set was reconstructed in 5% intervals of the 67-82 % of the R-R cycle. Diastolic phases were analyzed on a dedicated work station using MPR, MIP and VRT modes. The patient received 80 cc of contrast. Aorta:  Normal size.  (30 mm) no calcifications.  No dissection. Aortic Valve: No calcifications. Coronary Arteries:  Normal coronary origin.  Right dominance. RCA is a large dominant artery that gives rise to PDA and PLA. There is no plaque. Left main is a large artery that gives rise to LAD and LCX arteries. LAD is a large vessel that has a mid LAD stent (25 mm). Stent is widely patent. LCX is a non-dominant artery that gives rise to one large OM1 branch. There is a 25-49% mid LCX plaque at bifurcation of first OM. Other findings: Normal pulmonary vein drainage into the left atrium. Normal left atrial appendage without a thrombus. Normal size of the pulmonary artery. Please see radiology report for non cardiac findings. IMPRESSION: 1. Coronary calcium score is unable to be calculated due to LAD stent. 2. Normal coronary origin with right dominance. 3. Mid LAD 23m stent appears widely patent. 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 Electronically Signed   By: MCandee FurbishM.D.   On: 01/15/2021 16:48   Result Date: 01/15/2021 EXAM: OVER-READ INTERPRETATION  CT CHEST The following report is an over-read performed by radiologist Dr. GAletta Ed[facility]ardof [facility] [place] 01/15/2021.  This over-read does not include interpretation of cardiac or coronary anatomy or pathology. The coronary CTA interpretation by the cardiologist is attached. COMPARISON:  CT of the chest on 08/03/2020 FINDINGS: Vascular: No significant noncardiac vascular findings. Mediastinum/Nodes: Visualized mediastinum and hilar regions demonstrate no lymphadenopathy or masses. Lungs/Pleura: Visualized lungs show no evidence of pulmonary edema, consolidation, pneumothorax, nodule or pleural fluid. Upper Abdomen: Stable well-circumscribed cystic lesion in the right upper abdomen is favored to represent an exophytic renal cyst but could also be adrenal in origin. Internal density is consistent with simple cystic fluid. Musculoskeletal: No chest wall mass or suspicious bone lesions identified. IMPRESSION: No significant incidental findings. Stable cystic lesion in the right upper abdomen favored to represent an exophytic upper pole right renal cyst. Electronically Signed: By: GAletta EdouardM.D. On: 01/15/2021 15:30    Assessment & Plan:   JDevalwas seen today for gastroesophageal reflux.  Diagnoses and all orders for this visit:  Hematemesis of fresh blood- This was likely a Mallory-Weiss tear.  His H/H are slightly higher. Nonetheless, I have asked him to hold clopidogrel for the next week.  Will increase the dose of his PPI in the event that there is upper GI pathology.  I have also asked him to see GI to consider upper endoscopy. -     Ambulatory referral to Gastroenterology -     Basic metabolic panel; Future -     CBC with Differential/Platelet; Future -     CBC with Differential/Platelet -     Basic metabolic panel  Hypogonadism male -     Testosterone Enanthate (XYOSTED) 50 MG/0.5ML SOAJ; Inject 1 Act into the skin once a  week.  Hyperlipidemia with target LDL less than 70 -     atorvastatin (LIPITOR) 40 MG tablet; Take 1 tablet (40 mg total) by mouth daily.  Coronary artery disease involving native  coronary artery of native heart without angina pectoris -     atorvastatin (LIPITOR) 40 MG tablet; Take 1 tablet (40 mg total) by mouth daily.  Gastroesophageal reflux disease with esophagitis and hemorrhage -     pantoprazole (PROTONIX) 40 MG tablet; Take 1 tablet (40 mg total) by mouth 2 (two) times daily before a meal. -     Ambulatory referral to Gastroenterology -     Basic metabolic panel; Future -     CBC with Differential/Platelet; Future -     CBC with Differential/Platelet -     Basic metabolic panel  Stage 3a chronic kidney disease ([place])   I have discontinued Dellis Filbert Stoy's Testosterone and varenicline. I have also changed his atorvastatin and pantoprazole. Additionally, I am having him start on Xyosted. Lastly, I am having him maintain his aspirin, clonazePAM, clopidogrel, lamoTRIgine, lithium, nitroGLYCERIN, sildenafil, cyclobenzaprine, diclofenac Sodium, artificial tears, Vraylar, ciclopirox, desonide, hydrOXYzine, Trintellix, metoprolol tartrate, hydrocortisone, and levocetirizine.  Meds ordered this encounter  Medications   atorvastatin (LIPITOR) 40 MG tablet    Sig: Take 1 tablet (40 mg total) by mouth daily.    Dispense:  90 tablet    Refill:  1   pantoprazole (PROTONIX) 40 MG tablet    Sig: Take 1 tablet (40 mg total) by mouth 2 (two) times daily before a meal.    Dispense:  180 tablet    Refill:  1   Testosterone Enanthate (XYOSTED) 50 MG/0.5ML SOAJ    Sig: Inject 1 Act into the skin once a week.    Dispense:  6 mL    Refill:  0     Follow-up: Return in about 3 months (around 12/09/2021).  Scarlette Calico, MD

## 2021-09-09 DIAGNOSIS — K2101 Gastro-esophageal reflux disease with esophagitis, with bleeding: Secondary | ICD-10-CM | POA: Insufficient documentation

## 2021-09-09 DIAGNOSIS — N1831 Chronic kidney disease, stage 3a: Secondary | ICD-10-CM | POA: Insufficient documentation

## 2021-09-15 ENCOUNTER — Encounter: Payer: Self-pay | Admitting: Internal Medicine

## 2021-09-16 ENCOUNTER — Other Ambulatory Visit: Payer: Self-pay | Admitting: Internal Medicine

## 2021-09-16 ENCOUNTER — Other Ambulatory Visit: Payer: Self-pay | Admitting: *Deleted

## 2021-09-16 ENCOUNTER — Telehealth: Payer: Self-pay | Admitting: Cardiology

## 2021-09-16 DIAGNOSIS — I2510 Atherosclerotic heart disease of native coronary artery without angina pectoris: Secondary | ICD-10-CM

## 2021-09-16 DIAGNOSIS — F17210 Nicotine dependence, cigarettes, uncomplicated: Secondary | ICD-10-CM

## 2021-09-16 DIAGNOSIS — Z87891 Personal history of nicotine dependence: Secondary | ICD-10-CM

## 2021-09-16 DIAGNOSIS — Z122 Encounter for screening for malignant neoplasm of respiratory organs: Secondary | ICD-10-CM

## 2021-09-16 DIAGNOSIS — E785 Hyperlipidemia, unspecified: Secondary | ICD-10-CM

## 2021-09-16 MED ORDER — CLOPIDOGREL BISULFATE 75 MG PO TABS: 75.0000 mg | ORAL_TABLET | Freq: Every day | ORAL | 0 refills | Status: DC

## 2021-09-16 MED ORDER — ATORVASTATIN CALCIUM 40 MG PO TABS: 40.0000 mg | ORAL_TABLET | Freq: Every day | ORAL | 0 refills | Status: DC

## 2021-09-16 NOTE — Telephone Encounter (Signed)
*  STAT* If patient is at the pharmacy, call can be transferred to refill team.   1. Which medications need to be refilled? (please list name of each medication and dose if known)  clopidogrel (PLAVIX) 75 MG tablet atorvastatin (LIPITOR) 40 MG tablet   2. Which pharmacy/location (including street and city if local pharmacy) is medication to be sent to? [id number]- [place], [place] - [address]  3. Do they need a 30 day or 90 day supply? 90 day   Patient states he is completely out of medication

## 2021-09-21 ENCOUNTER — Encounter: Payer: Self-pay | Admitting: Internal Medicine

## 2021-09-27 ENCOUNTER — Ambulatory Visit
Admission: RE | Admit: 2021-09-27 | Discharge: 2021-09-27 | Disposition: A | Discharge status: Home / Self Care | Payer: BC Managed Care – PPO | Source: Ambulatory Visit | Attending: Acute Care | Admitting: Acute Care

## 2021-09-27 DIAGNOSIS — Z122 Encounter for screening for malignant neoplasm of respiratory organs: Secondary | ICD-10-CM

## 2021-09-27 DIAGNOSIS — Z87891 Personal history of nicotine dependence: Secondary | ICD-10-CM

## 2021-09-27 DIAGNOSIS — F17210 Nicotine dependence, cigarettes, uncomplicated: Secondary | ICD-10-CM

## 2021-09-29 ENCOUNTER — Other Ambulatory Visit: Payer: Self-pay

## 2021-09-29 ENCOUNTER — Encounter: Payer: Self-pay | Admitting: Internal Medicine

## 2021-09-29 DIAGNOSIS — Z122 Encounter for screening for malignant neoplasm of respiratory organs: Secondary | ICD-10-CM

## 2021-09-29 DIAGNOSIS — F17210 Nicotine dependence, cigarettes, uncomplicated: Secondary | ICD-10-CM

## 2021-09-29 DIAGNOSIS — Z87891 Personal history of nicotine dependence: Secondary | ICD-10-CM

## 2021-10-04 NOTE — Telephone Encounter (Signed)
Form has been received, completed, and given to PCP for review and signature.

## 2021-10-10 ENCOUNTER — Other Ambulatory Visit: Payer: Self-pay | Admitting: Internal Medicine

## 2021-10-18 ENCOUNTER — Encounter: Payer: Self-pay | Admitting: Cardiology

## 2021-10-21 NOTE — Telephone Encounter (Signed)
Kathlee Nations from [employer] called and would like to know if there is a PA for Testosterone Enanthate (XYOSTED) 50 MG/0.5ML SOAJ. If the PA was approved then she would like it faxed to [phone]. She said if the PA was denied then she would like for [place] to notify the pt that it was denied.   Callback for pt is [phone]. Callback for pharmacy: [phone]    Please advise.

## 2021-10-22 NOTE — Telephone Encounter (Signed)
Per chart no PA initiated and not once on dashboard. Will proceed w/ PA. Submitted PA w/ Key: [id number]. Waiting on insurance determination../l,mb

## 2021-10-22 NOTE — Telephone Encounter (Signed)
Rec'd determination fax med was Cancelled Today. Will call insurance to check on PA.Marland KitchenJohny Chess

## 2022-01-06 NOTE — Progress Notes (Unsigned)
Cardiology Office Note   Date:  01/07/2022   ID:  Louis Cardenas, DOB [birth year], MRN [id number]  PCP:  Janith Lima, MD  Cardiologist:   None Referring:  Janith Lima, MD   Chief Complaint  Patient presents with   Coronary Artery Disease     History of Present Illness: Louis Cardenas is a 56 y.o. male who was referred by Janith Lima, MD for evaluation of CAD.  He had a myocardial infarction 2011 in [place].   He unfortunately continues to smoke cigarettes.  These not leaving his house once he comes home from work.  He does not do much exercising.  He says he "she is going through the motions."  It does not sound like  he has any ideas of harm.  The patient denies any new symptoms such as chest discomfort, neck or arm discomfort. There has been no new shortness of breath, PND or orthopnea. There have been no reported palpitations, presyncope or syncope.  Past Medical History:  Diagnosis Date   Anxiety    Bipolar 1 disorder ([place])    CAD (coronary artery disease)    Depressed    Eating disorder    bulimia   Family history of colon cancer in father    GERD (gastroesophageal reflux disease)    History of colon polyps    Hypertension    Hypogonadism in male    MI (myocardial infarction) ([place])    Stroke ([place])    Thyroid nodule     Past Surgical History:  Procedure Laterality Date   APPENDECTOMY  1998   COLONOSCOPY     CORONARY ANGIOPLASTY WITH STENT PLACEMENT     ROTATOR CUFF REPAIR Left    with screws   UPPER GASTROINTESTINAL ENDOSCOPY       Current Outpatient Medications  Medication Sig Dispense Refill   aspirin 81 MG chewable tablet Chew by mouth daily.     atorvastatin (LIPITOR) 40 MG tablet Take 1 tablet (40 mg total) by mouth daily. 90 tablet 0   ciclopirox (LOPROX) 0.77 % SUSP Apply topically daily as needed.     clonazePAM (KLONOPIN) 0.5 MG tablet Take 0.5 mg by mouth as needed for anxiety.     clopidogrel (PLAVIX) 75 MG tablet Take  1 tablet (75 mg total) by mouth daily. 90 tablet 0   desonide (DESOWEN) 0.05 % lotion Apply topically daily as needed.     diclofenac Sodium (VOLTAREN) 1 % GEL Apply 4 g topically 4 (four) times daily. 100 g 0   hydrocortisone (ANUSOL-HC) 2.5 % rectal cream Place 1 application rectally 2 (two) times daily. 30 g 1   hydrOXYzine (ATARAX/VISTARIL) 25 MG tablet Take 25 mg by mouth 2 (two) times daily as needed.     lamoTRIgine (LAMICTAL) 200 MG tablet Take 200 mg by mouth daily.     lithium 300 MG tablet Take 300 mg by mouth daily.      nitroGLYCERIN (NITROSTAT) 0.4 MG SL tablet Place 0.4 mg under the tongue every 5 (five) minutes as needed for chest pain.     pantoprazole (PROTONIX) 40 MG tablet Take 1 tablet (40 mg total) by mouth 2 (two) times daily before a meal. 180 tablet 1   sildenafil (VIAGRA) 100 MG tablet Take 100 mg by mouth daily as needed for erectile dysfunction.     Testosterone Enanthate (XYOSTED) 50 MG/0.5ML SOAJ Inject 1 Act into the skin once a week. 6 mL 0   TRINTELLIX  10 MG TABS tablet Take 10 mg by mouth daily.     No current facility-administered medications for this visit.    Allergies:   Patient has no known allergies.    ROS:  Please see the history of present illness.   Otherwise, review of systems are positive for none.   All other systems are reviewed and negative.    PHYSICAL EXAM: VS:  BP 110/64   Pulse (!) 58   Ht 5' 5.5" (1.664 m)   Wt 141 lb (64 kg)   SpO2 99%   BMI 23.11 kg/m  , BMI Body mass index is 23.11 kg/m. GENERAL:  Well appearing NECK:  No jugular venous distention, waveform within normal limits, carotid upstroke brisk and symmetric, no bruits, no thyromegaly LUNGS:  Clear to auscultation bilaterally CHEST:  Unremarkable HEART:  PMI not displaced or sustained,S1 and S2 within normal limits, no S3, no S4, no clicks, no rubs, no murmurs ABD:  Flat, positive bowel sounds normal in frequency in pitch, no bruits, no rebound, no guarding, no  midline pulsatile mass, no hepatomegaly, no splenomegaly EXT:  2 plus pulses throughout, no edema, no cyanosis no clubbing  EKG:  EKG is  ordered today. Sinus rhythm, rate 58, axis within normal limits, intervals within normal limits, no acute ST-T wave changes.  Recent Labs: 08/16/2021: ALT 23 09/08/2021: BUN 19; Creatinine, Ser 1.42; Hemoglobin 14.3; Platelets 303.0; Potassium 4.7; Sodium 139    Lipid Panel    Component Value Date/Time   CHOL 119 08/16/2021 1401   TRIG 71.0 08/16/2021 1401   HDL 48.60 08/16/2021 1401   CHOLHDL 2 08/16/2021 1401   VLDL 14.2 08/16/2021 1401   LDLCALC 56 08/16/2021 1401      Wt Readings from Last 3 Encounters:  01/07/22 141 lb (64 kg)  09/08/21 141 lb (64 kg)  08/16/21 139 lb (63 kg)      Other studies Reviewed: Additional studies/ records that were reviewed today include:  Labs Review of the above records demonstrates:  Please see elsewhere in the note.     ASSESSMENT AND PLAN:  CAD:  The patient has no new sypmtoms.  No further cardiovascular testing is indicated.  We will continue with aggressive risk reduction and meds as listed.he has no new symptoms since his treadmill.  He had a patent stent on CT.   DYSLIPIDEMIA: LDL was 58 with an HDL of 48.  No change in therapy.   TOBACCO ABUSE:   I suggested he call [id number] we talked again about this.  We will get a look into over-the-counter nicotine replacements.   Current medicines are reviewed at length with the patient today.  The patient does not have concerns regarding medicines.  The following changes have been made: None  Labs/ tests ordered today include: None  Orders Placed This Encounter  Procedures   EKG 12-Lead    Disposition:   FU with me in 12 months.     Signed, Minus Breeding, MD  01/07/2022 10:17 AM    [facility]

## 2022-01-07 ENCOUNTER — Ambulatory Visit: Payer: BC Managed Care – PPO | Attending: Cardiology | Admitting: Cardiology

## 2022-01-07 ENCOUNTER — Encounter: Payer: Self-pay | Admitting: Cardiology

## 2022-01-07 VITALS — BP 110/64 | HR 58 | Ht 65.5 in | Wt 141.0 lb

## 2022-01-07 DIAGNOSIS — I2510 Atherosclerotic heart disease of native coronary artery without angina pectoris: Secondary | ICD-10-CM | POA: Diagnosis not present

## 2022-01-07 DIAGNOSIS — Z720 Tobacco use: Secondary | ICD-10-CM | POA: Diagnosis not present

## 2022-01-07 DIAGNOSIS — E785 Hyperlipidemia, unspecified: Secondary | ICD-10-CM | POA: Diagnosis not present

## 2022-01-07 NOTE — Patient Instructions (Signed)
Medication Instructions:  Your physician recommends that you continue on your current medications as directed. Please refer to the Current Medication list given to you today.  *If you need a refill on your cardiac medications before your next appointment, please call your pharmacy*   Lab Work: None   Testing/Procedures: None   Follow-Up: At [facility], you and your health needs are our priority.  As part of our continuing mission to provide you with exceptional heart care, we have created designated Provider Care Teams.  These Care Teams include your primary Cardiologist (physician) and Advanced Practice Providers (APPs -  Physician Assistants and Nurse Practitioners) who all work together to provide you with the care you need, when you need it.  We recommend signing up for the patient portal called "MyChart".  Sign up information is provided on this After Visit Summary.  MyChart is used to connect with patients for Virtual Visits (Telemedicine).  Patients are able to view lab/test results, encounter notes, upcoming appointments, etc.  Non-urgent messages can be sent to your provider as well.   To learn more about what you can do with MyChart, go to [email or web address].    Your next appointment:   1 year(s)  The format for your next appointment:   In Person  Provider:   Minus Breeding, MD    Other Instructions   Important Information About Sugar

## 2022-02-16 ENCOUNTER — Other Ambulatory Visit: Payer: Self-pay | Admitting: Internal Medicine

## 2022-02-16 DIAGNOSIS — I2510 Atherosclerotic heart disease of native coronary artery without angina pectoris: Secondary | ICD-10-CM

## 2022-03-03 ENCOUNTER — Encounter: Payer: Self-pay | Admitting: Cardiology

## 2022-03-04 ENCOUNTER — Encounter: Payer: Self-pay | Admitting: Cardiology

## 2022-03-04 MED ORDER — ATORVASTATIN CALCIUM 40 MG PO TABS: 40.0000 mg | ORAL_TABLET | Freq: Every day | ORAL | 3 refills | Status: DC

## 2022-03-07 ENCOUNTER — Encounter: Payer: Self-pay | Admitting: *Deleted

## 2022-03-07 NOTE — Telephone Encounter (Signed)
Letter  done and signed by Dr Percival Spanish.

## 2022-03-08 NOTE — Telephone Encounter (Signed)
Called patient informed letter is ready . RN asked patient how he would like to obtain letter , by mail or come to the office for pick up.  Patient states he would like letter to be mailed  Letter mailed .

## 2022-03-21 ENCOUNTER — Other Ambulatory Visit: Payer: Self-pay | Admitting: Internal Medicine

## 2022-03-21 DIAGNOSIS — J3089 Other allergic rhinitis: Secondary | ICD-10-CM

## 2022-04-24 ENCOUNTER — Other Ambulatory Visit: Payer: Self-pay | Admitting: Internal Medicine

## 2022-04-24 DIAGNOSIS — J3089 Other allergic rhinitis: Secondary | ICD-10-CM

## 2022-05-05 ENCOUNTER — Encounter: Payer: Self-pay | Admitting: Internal Medicine

## 2022-05-10 ENCOUNTER — Encounter: Payer: Self-pay | Admitting: Internal Medicine

## 2022-05-10 ENCOUNTER — Ambulatory Visit: Payer: BC Managed Care – PPO | Admitting: Internal Medicine

## 2022-05-10 VITALS — BP 120/68 | HR 64 | Temp 98.0°F | Ht 65.5 in | Wt 150.0 lb

## 2022-05-10 DIAGNOSIS — N1831 Chronic kidney disease, stage 3a: Secondary | ICD-10-CM | POA: Diagnosis not present

## 2022-05-10 DIAGNOSIS — R519 Headache, unspecified: Secondary | ICD-10-CM | POA: Diagnosis not present

## 2022-05-10 DIAGNOSIS — N5201 Erectile dysfunction due to arterial insufficiency: Secondary | ICD-10-CM | POA: Insufficient documentation

## 2022-05-10 DIAGNOSIS — E875 Hyperkalemia: Secondary | ICD-10-CM

## 2022-05-10 DIAGNOSIS — E785 Hyperlipidemia, unspecified: Secondary | ICD-10-CM | POA: Diagnosis not present

## 2022-05-10 DIAGNOSIS — E291 Testicular hypofunction: Secondary | ICD-10-CM

## 2022-05-10 DIAGNOSIS — R6882 Decreased libido: Secondary | ICD-10-CM

## 2022-05-10 LAB — HEPATIC FUNCTION PANEL
ALT: 18 U/L (ref 0–53)
AST: 20 U/L (ref 0–37)
Albumin: 4.6 g/dL (ref 3.5–5.2)
Alkaline Phosphatase: 88 U/L (ref 39–117)
Bilirubin, Direct: 0.1 mg/dL (ref 0.0–0.3)
Total Bilirubin: 0.3 mg/dL (ref 0.2–1.2)
Total Protein: 7 g/dL (ref 6.0–8.3)

## 2022-05-10 LAB — BASIC METABOLIC PANEL
BUN: 18 mg/dL (ref 6–23)
CO2: 25 mEq/L (ref 19–32)
Calcium: 10 mg/dL (ref 8.4–10.5)
Chloride: 108 mEq/L (ref 96–112)
Creatinine, Ser: 1.37 mg/dL (ref 0.40–1.50)
GFR: 58.37 mL/min — ABNORMAL LOW (ref >60.00)
Glucose, Bld: 107 mg/dL — ABNORMAL HIGH (ref 70–99)
Potassium: 5.4 mEq/L — ABNORMAL HIGH (ref 3.5–5.1)
Sodium: 138 mEq/L (ref 135–145)

## 2022-05-10 LAB — CBC WITH DIFFERENTIAL/PLATELET
Basophils Absolute: 0.2 10*3/uL — ABNORMAL HIGH (ref 0.0–0.1)
Basophils Relative: 1.9 % (ref 0.0–3.0)
Eosinophils Absolute: 0.3 10*3/uL (ref 0.0–0.7)
Eosinophils Relative: 2.9 % (ref 0.0–5.0)
HCT: 42.3 % (ref 39.0–52.0)
Hemoglobin: 13.7 g/dL (ref 13.0–17.0)
Lymphocytes Relative: 14.5 % (ref 12.0–46.0)
Lymphs Abs: 1.3 10*3/uL (ref 0.7–4.0)
MCHC: 32.4 g/dL (ref 30.0–36.0)
MCV: 86.1 fl (ref 78.0–100.0)
Monocytes Absolute: 0.5 10*3/uL (ref 0.1–1.0)
Monocytes Relative: 5.8 % (ref 3.0–12.0)
Neutro Abs: 6.8 10*3/uL (ref 1.4–7.7)
Neutrophils Relative %: 74.9 % (ref 43.0–77.0)
Platelets: 375 10*3/uL (ref 150.0–400.0)
RBC: 4.91 Mil/uL (ref 4.22–5.81)
RDW: 15.4 % (ref 11.5–15.5)
WBC: 9 10*3/uL (ref 4.0–10.5)

## 2022-05-10 LAB — TSH: TSH: 0.64 u[IU]/mL (ref 0.35–5.50)

## 2022-05-10 LAB — C-REACTIVE PROTEIN: CRP: <1 mg/dL (ref 0.5–20.0)

## 2022-05-10 NOTE — Patient Instructions (Signed)
Migraine Headache A migraine headache is an intense, throbbing pain on one side or both sides of the head. Migraine headaches may also cause other symptoms, such as nausea, vomiting, and sensitivity to light and noise. A migraine headache can last from 4 hours to 3 days. Talk with your doctor about what things may bring on (trigger) your migraine headaches. What are the causes? The exact cause of this condition is not known. However, a migraine may be caused when nerves in the brain become irritated and release chemicals that cause inflammation of blood vessels. This inflammation causes pain. This condition may be triggered or caused by: Drinking alcohol. Smoking. Taking medicines, such as: Medicine used to treat chest pain (nitroglycerin). Birth control pills. Estrogen. Certain blood pressure medicines. Eating or drinking products that contain nitrates, glutamate, aspartame, or tyramine. Aged cheeses, chocolate, or caffeine may also be triggers. Doing physical activity. Other things that may trigger a migraine headache include: Menstruation. Pregnancy. Hunger. Stress. Lack of sleep or too much sleep. Weather changes. Fatigue. What increases the risk? The following factors may make you more likely to experience migraine headaches: Being a certain age. This condition is more common in people who are 68-5 years old. Being male. Having a family history of migraine headaches. Being Caucasian. Having a mental health condition, such as depression or anxiety. Being obese. What are the signs or symptoms? The main symptom of this condition is pulsating or throbbing pain. This pain may: Happen in any area of the head, such as on one side or both sides. Interfere with daily activities. Get worse with physical activity. Get worse with exposure to bright lights or loud noises. Other symptoms may include: Nausea. Vomiting. Dizziness. General sensitivity to bright lights, loud noises, or  smells. Before you get a migraine headache, you may get warning signs (an aura). An aura may include: Seeing flashing lights or having blind spots. Seeing bright spots, halos, or zigzag lines. Having tunnel vision or blurred vision. Having numbness or a tingling feeling. Having trouble talking. Having muscle weakness. Some people have symptoms after a migraine headache (postdromal phase), such as: Feeling tired. Difficulty concentrating. How is this diagnosed? A migraine headache can be diagnosed based on: Your symptoms. A physical exam. Tests, such as: CT scan or an MRI of the head. These imaging tests can help rule out other causes of headaches. Taking fluid from the spine (lumbar puncture) and analyzing it (cerebrospinal fluid analysis, or CSF analysis). How is this treated? This condition may be treated with medicines that: Relieve pain. Relieve nausea. Prevent migraine headaches. Treatment for this condition may also include: Acupuncture. Lifestyle changes like avoiding foods that trigger migraine headaches. Biofeedback. Cognitive behavioral therapy. Follow these instructions at home: Medicines Take over-the-counter and prescription medicines only as told by your health care provider. Ask your health care provider if the medicine prescribed to you: Requires you to avoid driving or using heavy machinery. Can cause constipation. You may need to take these actions to prevent or treat constipation: Drink enough fluid to keep your urine pale yellow. Take over-the-counter or prescription medicines. Eat foods that are high in fiber, such as beans, whole grains, and fresh fruits and vegetables. Limit foods that are high in fat and processed sugars, such as fried or sweet foods. Lifestyle Do not drink alcohol. Do not use any products that contain nicotine or tobacco, such as cigarettes, e-cigarettes, and chewing tobacco. If you need help quitting, ask your health care  provider. Get at least 8  hours of sleep every night. Find ways to manage stress, such as meditation, deep breathing, or yoga. General instructions Keep a journal to find out what may trigger your migraine headaches. For example, write down: What you eat and drink. How much sleep you get. Any change to your diet or medicines. If you have a migraine headache: Avoid things that make your symptoms worse, such as bright lights. It may help to lie down in a dark, quiet room. Do not drive or use heavy machinery. Ask your health care provider what activities are safe for you while you are experiencing symptoms. Keep all follow-up visits as told by your health care provider. This is important. Contact a health care provider if: You develop symptoms that are different or more severe than your usual migraine headache symptoms. You have more than 15 headache days in one month. Get help right away if: Your migraine headache becomes severe. Your migraine headache lasts longer than 72 hours. You have a fever. You have a stiff neck. You have vision loss. Your muscles feel weak or like you cannot control them. You start to lose your balance often. You have trouble walking. You faint. You have a seizure. Summary A migraine headache is an intense, throbbing pain on one side or both sides of the head. Migraines may also cause other symptoms, such as nausea, vomiting, and sensitivity to light and noise. This condition may be treated with medicines and lifestyle changes. You may also need to avoid certain things that trigger a migraine headache. Keep a journal to find out what may trigger your migraine headaches. Contact your health care provider if you have more than 15 headache days in a month or you develop symptoms that are different or more severe than your usual migraine headache symptoms. This information is not intended to replace advice given to you by your health care provider. Make sure you  discuss any questions you have with your health care provider. Document Revised: 09/23/2021 Document Reviewed: 05/24/2018 Elsevier Patient Education  [place].

## 2022-05-10 NOTE — Progress Notes (Signed)
Subjective:  Patient ID: Louis Cardenas, male    DOB: 03-Jan-1966  Age: 57 y.o. MRN: [id number]  CC: Headache   HPI Kha Hari presents for f/up -  He complains of a 66-monthhistory of headache.  He points to the top of the right parietal scalp and draws a  line down to his right ear.  He describes it as a pounding/piercing sensation that increases with cough.  He says these are the worst headaches of his life.  He has not gotten much symptom relief with Tylenol or Advil.  He has a headache nearly every day.  He continues to have He denies nausea, vomiting, or paresthesias.  Outpatient Medications Prior to Visit  Medication Sig Dispense Refill   aspirin 81 MG chewable tablet Chew by mouth daily.     atorvastatin (LIPITOR) 40 MG tablet Take 1 tablet (40 mg total) by mouth daily. 90 tablet 3   ciclopirox (LOPROX) 0.77 % SUSP Apply topically daily as needed.     clonazePAM (KLONOPIN) 0.5 MG tablet Take 0.5 mg by mouth as needed for anxiety.     clopidogrel (PLAVIX) 75 MG tablet TAKE 1 TABLET BY MOUTH EVERY DAY 90 tablet 0   desonide (DESOWEN) 0.05 % lotion Apply topically daily as needed.     diclofenac Sodium (VOLTAREN) 1 % GEL Apply 4 g topically 4 (four) times daily. 100 g 0   hydrocortisone (ANUSOL-HC) 2.5 % rectal cream Place 1 application rectally 2 (two) times daily. 30 g 1   hydrOXYzine (ATARAX/VISTARIL) 25 MG tablet Take 25 mg by mouth 2 (two) times daily as needed.     lamoTRIgine (LAMICTAL) 200 MG tablet Take 200 mg by mouth daily.     levocetirizine (XYZAL) 5 MG tablet TAKE 1 TABLET BY MOUTH EVERY DAY IN THE EVENING 90 tablet 1   lithium 300 MG tablet Take 300 mg by mouth daily.      nitroGLYCERIN (NITROSTAT) 0.4 MG SL tablet Place 0.4 mg under the tongue every 5 (five) minutes as needed for chest pain.     pantoprazole (PROTONIX) 40 MG tablet Take 1 tablet (40 mg total) by mouth 2 (two) times daily before a meal. 180 tablet 1   sildenafil (VIAGRA) 100 MG tablet Take 100  mg by mouth daily as needed for erectile dysfunction.     Testosterone Enanthate (XYOSTED) 50 MG/0.5ML SOAJ Inject 1 Act into the skin once a week. 6 mL 0   TRINTELLIX 10 MG TABS tablet Take 10 mg by mouth daily.     No facility-administered medications prior to visit.    ROS Review of Systems  Constitutional: Negative.  Negative for diaphoresis, fatigue and unexpected weight change.  HENT:  Positive for nosebleeds. Negative for rhinorrhea, sinus pressure, sinus pain and trouble swallowing.   Eyes: Negative.   Respiratory:  Negative for cough, chest tightness, shortness of breath and wheezing.   Cardiovascular:  Negative for chest pain, palpitations and leg swelling.  Gastrointestinal:  Negative for abdominal pain, diarrhea, nausea and vomiting.  Endocrine: Negative.   Genitourinary: Negative.  Negative for difficulty urinating.       ++ ED and low libido  Musculoskeletal: Negative.  Negative for neck pain.  Skin: Negative.  Negative for color change.  Neurological:  Positive for headaches. Negative for dizziness, weakness and numbness.  Hematological:  Negative for adenopathy. Does not bruise/bleed easily.  Psychiatric/Behavioral: Negative.      Objective:  BP 120/68 (BP Location: Right Arm, Patient Position: Sitting, Cuff  Size: Large)   Pulse 64   Temp 98 F (36.7 C) (Oral)   Ht 5' 5.5" (1.664 m)   Wt 150 lb (68 kg)   SpO2 98%   BMI 24.58 kg/m   BP Readings from Last 3 Encounters:  05/10/22 120/68  01/07/22 110/64  09/08/21 118/72    Wt Readings from Last 3 Encounters:  05/10/22 150 lb (68 kg)  01/07/22 141 lb (64 kg)  09/08/21 141 lb (64 kg)    Physical Exam Vitals reviewed.  HENT:     Nose: Nose normal.     Right Nostril: No epistaxis.     Left Nostril: No epistaxis.     Mouth/Throat:     Mouth: Mucous membranes are moist.  Eyes:     General: No scleral icterus.    Conjunctiva/sclera: Conjunctivae normal.  Cardiovascular:     Rate and Rhythm: Normal  rate and regular rhythm.     Heart sounds: No murmur heard. Pulmonary:     Effort: Pulmonary effort is normal.     Breath sounds: No stridor. No wheezing, rhonchi or rales.  Abdominal:     General: Abdomen is flat.     Palpations: There is no mass.     Tenderness: There is no abdominal tenderness. There is no guarding.     Hernia: No hernia is present.  Musculoskeletal:        General: Normal range of motion.     Cervical back: Neck supple.     Right lower leg: No edema.     Left lower leg: No edema.  Lymphadenopathy:     Cervical: No cervical adenopathy.  Skin:    General: Skin is warm and dry.  Neurological:     Mental Status: He is alert.     Cranial Nerves: Cranial nerves 2-12 are intact.     Sensory: Sensation is intact.     Motor: Motor function is intact.     Coordination: Coordination is intact. Romberg sign negative. Coordination normal. Finger-Nose-Finger Test normal.     Deep Tendon Reflexes: Reflexes normal. Babinski sign absent on the right side. Babinski sign absent on the left side.     Reflex Scores:      Tricep reflexes are 0 on the right side.      Bicep reflexes are 1+ on the right side and 1+ on the left side.      Brachioradialis reflexes are 1+ on the right side and 1+ on the left side.      Patellar reflexes are 1+ on the right side and 1+ on the left side.      Achilles reflexes are 0 on the right side and 0 on the left side.    Lab Results  Component Value Date   WBC 9.0 05/10/2022   HGB 13.7 05/10/2022   HCT 42.3 05/10/2022   PLT 375.0 05/10/2022   GLUCOSE 107 (H) 05/10/2022   CHOL 119 08/16/2021   TRIG 71.0 08/16/2021   HDL 48.60 08/16/2021   LDLCALC 56 08/16/2021   ALT 18 05/10/2022   AST 20 05/10/2022   NA 138 05/10/2022   K 5.4 (H) 05/10/2022   CL 108 05/10/2022   CREATININE 1.37 05/10/2022   BUN 18 05/10/2022   CO2 25 05/10/2022   TSH 0.64 05/10/2022   PSA 0.48 07/08/2020   INR 1.0 08/16/2021    CT CHEST LUNG CA SCREEN LOW DOSE  W/O CM  Result Date: 09/28/2021 CLINICAL DATA:  57 year old male current  smoker with 31 pack-year history of smoking. Lung cancer screening examination. EXAM: CT CHEST WITHOUT CONTRAST LOW-DOSE FOR LUNG CANCER SCREENING TECHNIQUE: Multidetector CT imaging of the chest was performed following the standard protocol without IV contrast. RADIATION DOSE REDUCTION: This exam was performed according to the departmental dose-optimization program which includes automated exposure control, adjustment of the mA and/or kV according to patient size and/or use of iterative reconstruction technique. COMPARISON:  Cardiac CT 01/15/2021. Low-dose lung cancer screening chest CT 08/03/2020. FINDINGS: Cardiovascular: Heart size is normal. There is no significant pericardial fluid, thickening or pericardial calcification. There is aortic atherosclerosis, as well as atherosclerosis of the great vessels of the mediastinum and the coronary arteries, including calcified atherosclerotic plaque in the left anterior descending and left circumflex coronary arteries. Mediastinum/Nodes: No pathologically enlarged mediastinal or hilar lymph nodes. Please note that accurate exclusion of hilar adenopathy is limited on noncontrast CT scans. Esophagus is unremarkable in appearance. No axillary lymphadenopathy. Lungs/Pleura: No suspicious appearing pulmonary nodules or masses are noted. No acute consolidative airspace disease. No pleural effusions. Mild diffuse bronchial wall thickening with mild centrilobular and paraseptal emphysema. Upper Abdomen: Low-attenuation lesions are noted in both kidneys, incompletely characterized on today's non-contrast CT examination, but similar to prior studies, statistically likely to represent cysts (no imaging follow-up is recommended), largest of which is in the upper pole of the right kidney where there is a 5.1 x 4.6 cm lesion. Aortic atherosclerosis. Musculoskeletal: There are no aggressive appearing lytic or  blastic lesions noted in the visualized portions of the skeleton. IMPRESSION: 1. Lung-RADS 2S, benign appearance or behavior. Continue annual screening with low-dose chest CT without contrast in 12 months. 2. The "S" modifier above refers to potentially clinically significant non lung cancer related findings. Specifically, there is aortic atherosclerosis, in addition to two-vessel coronary artery disease. Please note that although the presence of coronary artery calcium documents the presence of coronary artery disease, the severity of this disease and any potential stenosis cannot be assessed on this non-gated CT examination. Assessment for potential risk factor modification, dietary therapy or pharmacologic therapy may be warranted, if clinically indicated. 3. Mild diffuse bronchial wall thickening with mild centrilobular and paraseptal emphysema; imaging findings suggestive of underlying COPD. Aortic Atherosclerosis (ICD10-I70.0) and Emphysema (ICD10-J43.9). Electronically Signed   By: Vinnie Langton M.D.   On: 09/28/2021 08:52   Assessment & Plan:   Amer was seen today for headache.  Diagnoses and all orders for this visit:  Worst headache of life- Labs are reassuring.  I recommend an MRI to evaluate for mass, NPH, bleed, aneurysm. -     MR Brain Wo Contrast; Future -     TSH; Future -     Cancel: Hepatic function panel; Future -     C-reactive protein; Future -     C-reactive protein -     TSH  Hypogonadism male- Testosterone is normal.  Will check a prolactin level. -     CBC with Differential/Platelet; Future -     Testosterone Total,Free,Bio, Males; Future -     Testosterone Total,Free,Bio, Males -     CBC with Differential/Platelet  Stage 3a chronic kidney disease ([place]) -     Basic metabolic panel; Future -     CBC with Differential/Platelet; Future -     Cancel: Hepatic function panel; Future -     CBC with Differential/Platelet -     Basic metabolic panel -     Basic  metabolic panel; Future  Hyperlipidemia with  target LDL less than 70 -     Hepatic function panel; Future -     Hepatic function panel  Erectile dysfunction due to arterial insufficiency -     sildenafil (REVATIO) 20 MG tablet; Take 4 tablets (80 mg total) by mouth daily as needed.  Hyperkalemia- I will recheck this and recommended he come back for an EKG to evaluate for cardiac toxicity. -     Basic metabolic panel; Future  Low libido -     Prolactin; Future   I am having Kristeen Miss start on sildenafil. I am also having him maintain his aspirin, clonazePAM, lamoTRIgine, lithium, nitroGLYCERIN, sildenafil, diclofenac Sodium, ciclopirox, desonide, hydrOXYzine, Trintellix, hydrocortisone, pantoprazole, Xyosted, clopidogrel, atorvastatin, and levocetirizine.  Meds ordered this encounter  Medications   sildenafil (REVATIO) 20 MG tablet    Sig: Take 4 tablets (80 mg total) by mouth daily as needed.    Dispense:  60 tablet    Refill:  2     Follow-up: Return in about 3 months (around 08/09/2022).  Scarlette Calico, MD

## 2022-05-11 LAB — TESTOSTERONE TOTAL,FREE,BIO, MALES
Albumin: 4.5 g/dL (ref 3.6–5.1)
Sex Hormone Binding: 43 nmol/L (ref 10–50)
Testosterone, Bioavailable: 104.2 ng/dL — ABNORMAL LOW (ref 110.0–575.0)
Testosterone, Free: 50.7 pg/mL (ref 46.0–224.0)
Testosterone: 475 ng/dL (ref 250–827)

## 2022-05-11 MED ORDER — SILDENAFIL CITRATE 20 MG PO TABS: 80.0000 mg | ORAL_TABLET | Freq: Every day | ORAL | 2 refills | Status: DC | PRN

## 2022-05-13 ENCOUNTER — Encounter: Payer: Self-pay | Admitting: Internal Medicine

## 2022-05-13 DIAGNOSIS — R6882 Decreased libido: Secondary | ICD-10-CM | POA: Insufficient documentation

## 2022-05-13 DIAGNOSIS — E875 Hyperkalemia: Secondary | ICD-10-CM | POA: Insufficient documentation

## 2022-05-18 ENCOUNTER — Encounter: Payer: Self-pay | Admitting: Internal Medicine

## 2022-05-18 ENCOUNTER — Ambulatory Visit: Payer: BC Managed Care – PPO | Admitting: Internal Medicine

## 2022-05-18 VITALS — BP 122/72 | HR 74 | Temp 97.9°F | Resp 16 | Ht 65.5 in | Wt 150.0 lb

## 2022-05-18 DIAGNOSIS — E875 Hyperkalemia: Secondary | ICD-10-CM | POA: Diagnosis not present

## 2022-05-18 DIAGNOSIS — Z125 Encounter for screening for malignant neoplasm of prostate: Secondary | ICD-10-CM

## 2022-05-18 DIAGNOSIS — E291 Testicular hypofunction: Secondary | ICD-10-CM

## 2022-05-18 DIAGNOSIS — N5201 Erectile dysfunction due to arterial insufficiency: Secondary | ICD-10-CM

## 2022-05-18 DIAGNOSIS — R6882 Decreased libido: Secondary | ICD-10-CM | POA: Diagnosis not present

## 2022-05-18 DIAGNOSIS — I2510 Atherosclerotic heart disease of native coronary artery without angina pectoris: Secondary | ICD-10-CM | POA: Diagnosis not present

## 2022-05-18 DIAGNOSIS — N1831 Chronic kidney disease, stage 3a: Secondary | ICD-10-CM | POA: Diagnosis not present

## 2022-05-18 LAB — FOLLICLE STIMULATING HORMONE: FSH: 3.9 m[IU]/mL (ref 1.4–18.1)

## 2022-05-18 LAB — BASIC METABOLIC PANEL
BUN: 20 mg/dL (ref 6–23)
CO2: 27 mEq/L (ref 19–32)
Calcium: 9.6 mg/dL (ref 8.4–10.5)
Chloride: 106 mEq/L (ref 96–112)
Creatinine, Ser: 1.23 mg/dL (ref 0.40–1.50)
GFR: 66.42 mL/min (ref >60.00)
Glucose, Bld: 139 mg/dL — ABNORMAL HIGH (ref 70–99)
Potassium: 4.7 mEq/L (ref 3.5–5.1)
Sodium: 140 mEq/L (ref 135–145)

## 2022-05-18 LAB — LUTEINIZING HORMONE: LH: 4.96 m[IU]/mL (ref 1.50–9.30)

## 2022-05-18 MED ORDER — TESTOSTERONE 50 MG/5GM (1%) TD GEL: 5.0000 g | Freq: Every day | TRANSDERMAL | 0 refills | Status: DC

## 2022-05-18 NOTE — Patient Instructions (Signed)
Hyperkalemia Hyperkalemia occurs when the level of potassium in the blood is too high. Potassium is an important mineral (electrolyte) that helps the muscles and nerves function normally. It affects how the heart works, and it helps keep fluids and minerals balanced in the body. If there is too much potassium in your blood, it can affect your heart's ability to function normally. Potassium is normally removed (excreted) from the body by the kidneys. Hyperkalemia can result from various conditions. It can range from mild to severe. What are the causes? This condition may be caused by: Taking in too much potassium. This can happen if: You use salt substitutes. They contain large amounts of potassium. You take potassium supplements. You eat too many foods that are high in potassium if you have kidney disease. Excreting too little potassium. This can happen if: Your kidneys are not working properly. Kidney (renal) disease, including short-term or long-term renal failure, is a common cause of hyperkalemia. You are taking medicines that lower your excretion of potassium, such as ACE inhibitors, angiotensin II receptor blockers (ARBs), or potassium-sparing diuretics, such as spironolactone. You have Addison's disease. You have a urinary tract blockage, such as kidney stones. You are on treatment to mechanically clean your blood (dialysis) and you skip a treatment. Your cells releasing a high amount of potassium into the blood. This can happen with: Injury to muscles (rhabdomyolysis) or other tissues. Most potassium is stored in your muscles. Severe burns, injuries, or infections. Acidic blood plasma (acidosis). Acidosis can result from many diseases, such as uncontrolled diabetes. What increases the risk? You are more likely to develop this condition if you have alcoholism or if you use drugs heavily. What are the signs or symptoms? In many cases, there are no symptoms. However, when your potassium  level becomes high enough, you may have symptoms such as: An irregular or very slow heartbeat. Nausea. Tiredness (fatigue). Confusion. Tingling of your skin or numbness of your hands or feet. Muscle cramps. Muscle weakness. Not being able to move (paralysis). How is this diagnosed? This condition may be diagnosed based on: Your symptoms and medical history. Your health care provider will ask about your use of over-the-counter and prescription medicines. A physical exam. Blood tests. An electrocardiogram (ECG). How is this treated? Treatment depends on the cause and severity of your condition. Treatment may need to be done in the hospital setting. Treatment may include: Receiving a sugar solution (glucose) through an IV along with insulin to shift potassium out of your blood and into your cells. Taking a medicine called albuterol to shift potassium out of your blood and into your cells. Taking medicines to remove the potassium from your body. Having dialysis to remove the potassium from your body. Taking calcium to protect your heart from the effects of high potassium, such as irregular rhythms (arrhythmias). Follow these instructions at home:  Take over-the-counter and prescription medicines only as told by your health care provider. Do not take any supplements, natural food products, herbs, or vitamins without reviewing them with your health care provider. Certain supplements and natural food products contain high amounts of potassium. If you drink alcohol, limit how much you have as told by your health care provider. Do not use illegal drugs. If you need help quitting, ask your health care provider. If you have kidney disease, you may need to follow a low-potassium diet. A dietitian can help you learn which foods have high or low amounts of potassium. Keep all follow-up visits. This is important.  Contact a health care provider if: You have an irregular or very slow heartbeat. You  feel light-headed. You feel weak. You are nauseous. You have tingling or numbness in your hands or feet. Get help right away if: You have shortness of breath. You have chest pain or discomfort. You faint. You have muscle paralysis. These symptoms may be an emergency. Get help right away. Call 911. Do not wait to see if the symptoms will go away. Do not drive yourself to the hospital. Summary Hyperkalemia occurs when the level of potassium in your blood is too high. This condition may be caused by taking in too much potassium, excreting too little potassium, or releasing a high amount of potassium from your cells into your blood. Hyperkalemia can result from many underlying conditions, especially chronic kidney disease, or from taking certain medicines. Treatment of hyperkalemia may include medicine to shift potassium out of your blood and into your cells or to remove the potassium from your body. If you have kidney disease, you may need to follow a low-potassium diet. A dietitian can help you learn which foods have high or low amounts of potassium. This information is not intended to replace advice given to you by your health care provider. Make sure you discuss any questions you have with your health care provider. Document Revised: 12/24/2020 Document Reviewed: 12/24/2020 Elsevier Patient Education  [place].

## 2022-05-18 NOTE — Progress Notes (Signed)
Subjective:  Patient ID: Louis Cardenas, male    DOB: [birth year]  Age: 57 y.o. MRN: [id number]  CC: Coronary Artery Disease   HPI Rosaire Cueto presents for f/up -   He continues to c/o low libido and ED. He is active and denies DOE, CP, SOB, edema.  Outpatient Medications Prior to Visit  Medication Sig Dispense Refill   aspirin 81 MG chewable tablet Chew by mouth daily.     atorvastatin (LIPITOR) 40 MG tablet Take 1 tablet (40 mg total) by mouth daily. 90 tablet 3   ciclopirox (LOPROX) 0.77 % SUSP Apply topically daily as needed.     clonazePAM (KLONOPIN) 0.5 MG tablet Take 0.5 mg by mouth as needed for anxiety.     desonide (DESOWEN) 0.05 % lotion Apply topically daily as needed.     diclofenac Sodium (VOLTAREN) 1 % GEL Apply 4 g topically 4 (four) times daily. 100 g 0   hydrocortisone (ANUSOL-HC) 2.5 % rectal cream Place 1 application rectally 2 (two) times daily. 30 g 1   hydrOXYzine (ATARAX/VISTARIL) 25 MG tablet Take 25 mg by mouth 2 (two) times daily as needed.     lamoTRIgine (LAMICTAL) 200 MG tablet Take 200 mg by mouth daily.     levocetirizine (XYZAL) 5 MG tablet TAKE 1 TABLET BY MOUTH EVERY DAY IN THE EVENING 90 tablet 1   lithium 300 MG tablet Take 300 mg by mouth daily.      nitroGLYCERIN (NITROSTAT) 0.4 MG SL tablet Place 0.4 mg under the tongue every 5 (five) minutes as needed for chest pain.     pantoprazole (PROTONIX) 40 MG tablet Take 1 tablet (40 mg total) by mouth 2 (two) times daily before a meal. 180 tablet 1   sildenafil (REVATIO) 20 MG tablet Take 4 tablets (80 mg total) by mouth daily as needed. 60 tablet 2   sildenafil (VIAGRA) 100 MG tablet Take 100 mg by mouth daily as needed for erectile dysfunction.     TRINTELLIX 10 MG TABS tablet Take 10 mg by mouth daily.     clopidogrel (PLAVIX) 75 MG tablet TAKE 1 TABLET BY MOUTH EVERY DAY 90 tablet 0   Testosterone Enanthate (XYOSTED) 50 MG/0.5ML SOAJ Inject 1 Act into the skin once a week. 6 mL 0   No  facility-administered medications prior to visit.    ROS Review of Systems  Constitutional:  Negative for chills, diaphoresis, fatigue and fever.  HENT: Negative.    Eyes: Negative.   Respiratory:  Negative for cough, chest tightness and shortness of breath.   Cardiovascular:  Negative for chest pain, palpitations and leg swelling.  Gastrointestinal:  Negative for abdominal pain, constipation, diarrhea, nausea and vomiting.  Genitourinary: Negative.  Negative for difficulty urinating and dysuria.  Musculoskeletal: Negative.  Negative for arthralgias and myalgias.  Skin: Negative.   Neurological:  Negative for dizziness, weakness and headaches.  Hematological:  Negative for adenopathy. Does not bruise/bleed easily.  Psychiatric/Behavioral: Negative.      Objective:  BP 122/72 (BP Location: Left Arm, Patient Position: Sitting, Cuff Size: Large)   Pulse 74   Temp 97.9 F (36.6 C) (Oral)   Resp 16   Ht 5' 5.5" (1.664 m)   Wt 150 lb (68 kg)   SpO2 99%   BMI 24.58 kg/m   BP Readings from Last 3 Encounters:  05/18/22 122/72  05/10/22 120/68  01/07/22 110/64    Wt Readings from Last 3 Encounters:  05/18/22 150 lb (68 kg)  05/10/22  150 lb (68 kg)  01/07/22 141 lb (64 kg)    Physical Exam Vitals reviewed.  HENT:     Nose: Nose normal.     Mouth/Throat:     Mouth: Mucous membranes are moist.  Eyes:     General: No scleral icterus.    Conjunctiva/sclera: Conjunctivae normal.  Cardiovascular:     Rate and Rhythm: Normal rate and regular rhythm.     Heart sounds: Normal heart sounds, S1 normal and S2 normal.     Comments: EKG- NSR, 64 bpm LAE No peaked T waves +artifact TWI in V1V2 Pulmonary:     Effort: Pulmonary effort is normal.     Breath sounds: No stridor. No wheezing, rhonchi or rales.  Abdominal:     General: Abdomen is flat.     Palpations: There is no mass.     Tenderness: There is no abdominal tenderness. There is no guarding.     Hernia: No hernia is  present.  Musculoskeletal:        General: Normal range of motion.     Cervical back: Neck supple.     Right lower leg: No edema.     Left lower leg: No edema.  Skin:    General: Skin is warm and dry.  Neurological:     General: No focal deficit present.     Mental Status: He is alert and oriented to person, place, and time.  Psychiatric:        Mood and Affect: Mood normal.        Behavior: Behavior normal.     Lab Results  Component Value Date   WBC 9.0 05/10/2022   HGB 13.7 05/10/2022   HCT 42.3 05/10/2022   PLT 375.0 05/10/2022   GLUCOSE 139 (H) 05/18/2022   CHOL 119 08/16/2021   TRIG 71.0 08/16/2021   HDL 48.60 08/16/2021   LDLCALC 56 08/16/2021   ALT 18 05/10/2022   AST 20 05/10/2022   NA 140 05/18/2022   K 4.7 05/18/2022   CL 106 05/18/2022   CREATININE 1.23 05/18/2022   BUN 20 05/18/2022   CO2 27 05/18/2022   TSH 0.64 05/10/2022   PSA 0.48 07/08/2020   INR 1.0 08/16/2021    CT CHEST LUNG CA SCREEN LOW DOSE W/O CM  Result Date: 09/28/2021 CLINICAL DATA:  57 year old male current smoker with 31 pack-year history of smoking. Lung cancer screening examination. EXAM: CT CHEST WITHOUT CONTRAST LOW-DOSE FOR LUNG CANCER SCREENING TECHNIQUE: Multidetector CT imaging of the chest was performed following the standard protocol without IV contrast. RADIATION DOSE REDUCTION: This exam was performed according to the departmental dose-optimization program which includes automated exposure control, adjustment of the mA and/or kV according to patient size and/or use of iterative reconstruction technique. COMPARISON:  Cardiac CT 01/15/2021. Low-dose lung cancer screening chest CT 08/03/2020. FINDINGS: Cardiovascular: Heart size is normal. There is no significant pericardial fluid, thickening or pericardial calcification. There is aortic atherosclerosis, as well as atherosclerosis of the great vessels of the mediastinum and the coronary arteries, including calcified atherosclerotic  plaque in the left anterior descending and left circumflex coronary arteries. Mediastinum/Nodes: No pathologically enlarged mediastinal or hilar lymph nodes. Please note that accurate exclusion of hilar adenopathy is limited on noncontrast CT scans. Esophagus is unremarkable in appearance. No axillary lymphadenopathy. Lungs/Pleura: No suspicious appearing pulmonary nodules or masses are noted. No acute consolidative airspace disease. No pleural effusions. Mild diffuse bronchial wall thickening with mild centrilobular and paraseptal emphysema. Upper Abdomen: Low-attenuation lesions  are noted in both kidneys, incompletely characterized on today's non-contrast CT examination, but similar to prior studies, statistically likely to represent cysts (no imaging follow-up is recommended), largest of which is in the upper pole of the right kidney where there is a 5.1 x 4.6 cm lesion. Aortic atherosclerosis. Musculoskeletal: There are no aggressive appearing lytic or blastic lesions noted in the visualized portions of the skeleton. IMPRESSION: 1. Lung-RADS 2S, benign appearance or behavior. Continue annual screening with low-dose chest CT without contrast in 12 months. 2. The "S" modifier above refers to potentially clinically significant non lung cancer related findings. Specifically, there is aortic atherosclerosis, in addition to two-vessel coronary artery disease. Please note that although the presence of coronary artery calcium documents the presence of coronary artery disease, the severity of this disease and any potential stenosis cannot be assessed on this non-gated CT examination. Assessment for potential risk factor modification, dietary therapy or pharmacologic therapy may be warranted, if clinically indicated. 3. Mild diffuse bronchial wall thickening with mild centrilobular and paraseptal emphysema; imaging findings suggestive of underlying COPD. Aortic Atherosclerosis (ICD10-I70.0) and Emphysema (ICD10-J43.9).  Electronically Signed   By: Vinnie Langton M.D.   On: 09/28/2021 08:52   Assessment & Plan:   Jesiah was seen today for coronary artery disease.  Diagnoses and all orders for this visit:   Hyperkalemia- K+ is normal now. No EKG changes -     EKG 12-Lead -     Basic metabolic panel; Future -     Basic metabolic panel  Coronary artery disease involving native coronary artery of native heart without angina pectoris- EKG is reassuring. No recent angina. -     EKG 12-Lead  Stage 3a chronic kidney disease ([place]) - His renal function is stable. -     Basic metabolic panel; Future -     Basic metabolic panel  Low libido -     Prolactin; Future -     Follicle stimulating hormone; Future -     Luteinizing hormone; Future -     Luteinizing hormone -     Follicle stimulating hormone -     Prolactin  Erectile dysfunction due to arterial insufficiency -     Follicle stimulating hormone; Future -     Luteinizing hormone; Future -     Luteinizing hormone -     Follicle stimulating hormone  Hypogonadism male- His bioavailable T is low so will start TRT. -     Follicle stimulating hormone; Future -     Luteinizing hormone; Future -     Luteinizing hormone -     Follicle stimulating hormone -     testosterone (ANDROGEL) 50 MG/5GM (1%) GEL; Place 5 g onto the skin daily.   I have discontinued [employer]. I am also having him start on testosterone. Additionally, I am having him maintain his aspirin, clonazePAM, lamoTRIgine, lithium, nitroGLYCERIN, sildenafil, diclofenac Sodium, ciclopirox, desonide, hydrOXYzine, Trintellix, hydrocortisone, pantoprazole, atorvastatin, levocetirizine, and sildenafil.  Meds ordered this encounter  Medications   testosterone (ANDROGEL) 50 MG/5GM (1%) GEL    Sig: Place 5 g onto the skin daily.    Dispense:  450 g    Refill:  0     Follow-up: Return in about 3 months (around 08/17/2022).  Scarlette Calico, MD

## 2022-05-19 LAB — PROLACTIN: Prolactin: 4.7 ng/mL (ref 2.0–18.0)

## 2022-05-20 ENCOUNTER — Other Ambulatory Visit: Payer: Self-pay | Admitting: Internal Medicine

## 2022-05-20 DIAGNOSIS — I2510 Atherosclerotic heart disease of native coronary artery without angina pectoris: Secondary | ICD-10-CM

## 2022-05-23 ENCOUNTER — Encounter: Payer: Self-pay | Admitting: Internal Medicine

## 2022-05-23 DIAGNOSIS — Z125 Encounter for screening for malignant neoplasm of prostate: Secondary | ICD-10-CM | POA: Insufficient documentation

## 2022-05-24 ENCOUNTER — Other Ambulatory Visit (INDEPENDENT_AMBULATORY_CARE_PROVIDER_SITE_OTHER): Payer: BC Managed Care – PPO

## 2022-05-24 DIAGNOSIS — Z125 Encounter for screening for malignant neoplasm of prostate: Secondary | ICD-10-CM

## 2022-05-24 LAB — PSA: PSA: 0.5 ng/mL (ref 0.10–4.00)

## 2022-05-26 ENCOUNTER — Telehealth: Payer: Self-pay | Admitting: *Deleted

## 2022-05-26 NOTE — Telephone Encounter (Signed)
Pt has been scheduled for tele pre op 06/01/22. Med rec and consent are done.

## 2022-05-26 NOTE — Telephone Encounter (Signed)
   Pre-operative Risk Assessment    Patient Name: Louis Cardenas  DOB: 04-Aug-1965 MRN: [id number]      Request for Surgical Clearance    Procedure:   septoplasty, bilateral turbinate reduction  Date of Surgery:  Clearance 06/09/22                                 Surgeon:  Dr. Tamsen Roers [facility] Name:  [facility] Phone number:  [phone] Fax number:  [phone]   Type of Clearance Requested:   - Medical  - Pharmacy:  Hold Clopidogrel (Plavix) for 7 days   Type of Anesthesia:  General    Additional requests/questions:    [name]   05/26/2022, 9:04 AM

## 2022-05-26 NOTE — Telephone Encounter (Signed)
Please contact the patient and arrange virtual telephone visit for next week.  Low risk procedure.  Last PCI was in 2011, normal Myoview in April 2022.  If no anginal symptom, may proceed after holding Plavix for 7 days.

## 2022-05-26 NOTE — Telephone Encounter (Signed)
Pt has been scheduled for tele pre op 06/01/22. Med rec and consent are done.     Patient Consent for Virtual Visit        Louis Cardenas has provided verbal consent on 05/26/2022 for a virtual visit (video or telephone).   CONSENT FOR VIRTUAL VISIT FOR:  Louis Cardenas  By participating in this virtual visit I agree to the following:  I hereby voluntarily request, consent and authorize [facility] and its employed or contracted physicians, physician assistants, nurse practitioners or other licensed health care professionals (the Practitioner), to provide me with telemedicine health care services (the "Services") as deemed necessary by the treating Practitioner. I acknowledge and consent to receive the Services by the Practitioner via telemedicine. I understand that the telemedicine visit will involve communicating with the Practitioner through live audiovisual communication technology and the disclosure of certain medical information by electronic transmission. I acknowledge that I have been given the opportunity to request an in-person assessment or other available alternative prior to the telemedicine visit and am voluntarily participating in the telemedicine visit.  I understand that I have the right to withhold or withdraw my consent to the use of telemedicine in the course of my care at any time, without affecting my right to future care or treatment, and that the Practitioner or I may terminate the telemedicine visit at any time. I understand that I have the right to inspect all information obtained and/or recorded in the course of the telemedicine visit and may receive copies of available information for a reasonable fee.  I understand that some of the potential risks of receiving the Services via telemedicine include:  Delay or interruption in medical evaluation due to technological equipment failure or disruption; Information transmitted may not be sufficient (e.g. poor  resolution of images) to allow for appropriate medical decision making by the Practitioner; and/or  In rare instances, security protocols could fail, causing a breach of personal health information.  Furthermore, I acknowledge that it is my responsibility to provide information about my medical history, conditions and care that is complete and accurate to the best of my ability. I acknowledge that Practitioner's advice, recommendations, and/or decision may be based on factors not within their control, such as incomplete or inaccurate data provided by me or distortions of diagnostic images or specimens that may result from electronic transmissions. I understand that the practice of medicine is not an exact science and that Practitioner makes no warranties or guarantees regarding treatment outcomes. I acknowledge that a copy of this consent can be made available to me via my patient portal (Louis Cardenas), or I can request a printed copy by calling the office of [facility].    I understand that my insurance will be billed for this visit.   I have read or had this consent read to me. I understand the contents of this consent, which adequately explains the benefits and risks of the Services being provided via telemedicine.  I have been provided ample opportunity to ask questions regarding this consent and the Services and have had my questions answered to my satisfaction. I give my informed consent for the services to be provided through the use of telemedicine in my medical care

## 2022-06-01 ENCOUNTER — Ambulatory Visit: Payer: BC Managed Care – PPO | Attending: Cardiovascular Disease | Admitting: Nurse Practitioner

## 2022-06-01 DIAGNOSIS — Z01810 Encounter for preprocedural cardiovascular examination: Secondary | ICD-10-CM

## 2022-06-01 NOTE — Progress Notes (Signed)
Virtual Visit via Telephone Note   Because of Louis Cardenas's co-morbid illnesses, he is at least at moderate risk for complications without adequate follow up.  This format is felt to be most appropriate for this patient at this time.  The patient did not have access to video technology/had technical difficulties with video requiring transitioning to audio format only (telephone).  All issues noted in this document were discussed and addressed.  No physical exam could be performed with this format.  Please refer to the patient's chart for his consent to telehealth for [facility].  Evaluation Performed:  Preoperative cardiovascular risk assessment _____________   Date:  06/01/2022   Patient ID:  Louis Cardenas, DOB 09-Dec-1965, MRN [id number] Patient Location:  Home Provider location:   Office  Primary Care Provider:  Janith Lima, MD Primary Cardiologist:  None  Chief Complaint / Patient Profile   57 y.o. y/o male with a h/o CAD with prior stenting, hypertension, dyslipidemia, CVA, and tobacco use who is pending septoplasty, bilateral turbinate reduction on 06/09/2022 with Dr. Kasandra Cardenas Louis Cardenas of [facility] and presents today for telephonic preoperative cardiovascular risk assessment.  History of Present Illness    Louis Cardenas is a 57 y.o. male who presents via audio/video conferencing for a telehealth visit today.  Pt was last seen in cardiology clinic on 01/07/2022 by Dr. Percival Cardenas.  At that time Louis Cardenas was doing well.  The patient is now pending procedure as outlined above. Since his last visit, he has done well from a cardiac standpoint.   He denies chest pain, palpitations, dyspnea, pnd, orthopnea, n, v, dizziness, syncope, edema, weight gain, or early satiety. All other systems reviewed and are otherwise negative except as noted above.   Past Medical History    Past Medical History:  Diagnosis Date   Anxiety    Bipolar 1 disorder ([place])    CAD (coronary  artery disease)    Depressed    Eating disorder    bulimia   Family history of colon cancer in father    GERD (gastroesophageal reflux disease)    History of colon polyps    Hypertension    Hypogonadism in male    MI (myocardial infarction) ([place])    Stroke ([place])    Thyroid nodule    Past Surgical History:  Procedure Laterality Date   APPENDECTOMY  1998   COLONOSCOPY     CORONARY ANGIOPLASTY WITH STENT PLACEMENT     ROTATOR CUFF REPAIR Left    with screws   UPPER GASTROINTESTINAL ENDOSCOPY      Allergies  No Known Allergies  Home Medications    Prior to Admission medications   Medication Sig Start Date End Date Taking? Authorizing Provider  aspirin 81 MG chewable tablet Chew by mouth daily. Patient not taking: Reported on 05/26/2022    [provider]  atorvastatin (LIPITOR) 40 MG tablet Take 1 tablet (40 mg total) by mouth daily. 03/04/22   Minus Breeding, MD  ciclopirox (LOPROX) 0.77 % SUSP Apply topically daily as needed. 11/17/20   [provider]  clonazePAM (KLONOPIN) 0.5 MG tablet Take 0.5 mg by mouth as needed for anxiety.    [provider]  clopidogrel (PLAVIX) 75 MG tablet TAKE 1 TABLET BY MOUTH EVERY DAY 05/20/22   Louis Lima, MD  desonide (DESOWEN) 0.05 % lotion Apply topically daily as needed. 11/17/20   [provider]  diclofenac Sodium (VOLTAREN) 1 % GEL Apply 4 g topically 4 (  four) times daily. 10/02/19   Masoudi, Elhamalsadat, MD  hydrocortisone (ANUSOL-HC) 2.5 % rectal cream Place 1 application rectally 2 (two) times daily. 01/29/21   Thornton [place], MD  hydrOXYzine (ATARAX/VISTARIL) 25 MG tablet Take 25 mg by mouth 2 (two) times daily as needed. 11/12/20   [provider]  lamoTRIgine (LAMICTAL) 200 MG tablet Take 200 mg by mouth daily.    [provider]  levocetirizine (XYZAL) 5 MG tablet TAKE 1 TABLET BY MOUTH EVERY DAY IN THE EVENING 04/26/22   Louis Lima, MD  lithium 300 MG tablet Take 300 mg  by mouth daily.     [provider]  nitroGLYCERIN (NITROSTAT) 0.4 MG SL tablet Place 0.4 mg under the tongue every 5 (five) minutes as needed for chest pain.    [provider]  pantoprazole (PROTONIX) 40 MG tablet Take 1 tablet (40 mg total) by mouth 2 (two) times daily before a meal. 09/08/21   Louis Lima, MD  sildenafil (REVATIO) 20 MG tablet Take 4 tablets (80 mg total) by mouth daily as needed. 05/11/22   Louis Lima, MD  sildenafil (VIAGRA) 100 MG tablet Take 100 mg by mouth daily as needed for erectile dysfunction.    [provider]  testosterone (ANDROGEL) 50 MG/5GM (1%) GEL Place 5 g onto the skin daily. 05/18/22   Louis Lima, MD  TRINTELLIX 10 MG TABS tablet Take 10 mg by mouth daily. 08/27/20   [provider]    Physical Exam    Vital Signs:  Louis Cardenas does not have vital signs available for review today.  Given telephonic nature of communication, physical exam is limited. AAOx3. NAD. Normal affect.  Speech and respirations are unlabored.  Accessory Clinical Findings    None  Assessment & Plan    1.  Preoperative Cardiovascular Risk Assessment:  According to the Revised Cardiac Risk Index (RCRI), his Perioperative Risk of Major Cardiac Event is (%): 6.6. His Functional Capacity in METs is: 7.99 according to the [place] Activity Status Index (DASI). Therefore, based on ACC/AHA guidelines, patient would be at acceptable risk for the planned procedure without further cardiovascular testing.  The patient was advised that if he develops new symptoms prior to surgery to contact our office to arrange for a follow-up visit, and he verbalized understanding.  From a cardiac perspective, patient may hold Plavix for 5 to 7 days prior to procedure. However, patient also takes Plavix for history of CVA. Final recommendations for holding Plavix prior to procedure should come from managing provider (PCP).   A copy of this note will be  routed to requesting surgeon.  Time:   Today, I have spent 6 minutes with the patient with telehealth technology discussing medical history, symptoms, and management plan.     Lenna Sciara, NP  06/01/2022, 10:36 AM

## 2022-06-18 ENCOUNTER — Other Ambulatory Visit: Payer: BC Managed Care – PPO

## 2022-06-20 ENCOUNTER — Ambulatory Visit
Admission: RE | Admit: 2022-06-20 | Discharge: 2022-06-20 | Disposition: A | Discharge status: Home / Self Care | Payer: BC Managed Care – PPO | Source: Ambulatory Visit | Attending: Internal Medicine | Admitting: Internal Medicine

## 2022-06-20 ENCOUNTER — Emergency Department (HOSPITAL_COMMUNITY)
Admission: EM | Admit: 2022-06-20 | Discharge: 2022-06-20 | Disposition: A | Discharge status: Home / Self Care | Payer: BC Managed Care – PPO | Attending: Emergency Medicine | Admitting: Emergency Medicine

## 2022-06-20 ENCOUNTER — Encounter: Payer: Self-pay | Admitting: Internal Medicine

## 2022-06-20 ENCOUNTER — Encounter (HOSPITAL_COMMUNITY): Payer: Self-pay | Admitting: Emergency Medicine

## 2022-06-20 ENCOUNTER — Other Ambulatory Visit: Payer: Self-pay | Admitting: Internal Medicine

## 2022-06-20 ENCOUNTER — Emergency Department (HOSPITAL_COMMUNITY): Payer: BC Managed Care – PPO

## 2022-06-20 ENCOUNTER — Other Ambulatory Visit: Payer: Self-pay

## 2022-06-20 DIAGNOSIS — R519 Headache, unspecified: Secondary | ICD-10-CM

## 2022-06-20 DIAGNOSIS — R42 Dizziness and giddiness: Secondary | ICD-10-CM | POA: Insufficient documentation

## 2022-06-20 DIAGNOSIS — G43511 Persistent migraine aura without cerebral infarction, intractable, with status migrainosus: Secondary | ICD-10-CM | POA: Insufficient documentation

## 2022-06-20 DIAGNOSIS — G9331 Postviral fatigue syndrome: Secondary | ICD-10-CM

## 2022-06-20 DIAGNOSIS — I2510 Atherosclerotic heart disease of native coronary artery without angina pectoris: Secondary | ICD-10-CM | POA: Insufficient documentation

## 2022-06-20 DIAGNOSIS — E860 Dehydration: Secondary | ICD-10-CM

## 2022-06-20 DIAGNOSIS — R079 Chest pain, unspecified: Secondary | ICD-10-CM | POA: Diagnosis present

## 2022-06-20 DIAGNOSIS — I10 Essential (primary) hypertension: Secondary | ICD-10-CM | POA: Diagnosis not present

## 2022-06-20 LAB — CBC
HCT: 44.7 % (ref 39.0–52.0)
Hemoglobin: 14.8 g/dL (ref 13.0–17.0)
MCH: 27.6 pg (ref 26.0–34.0)
MCHC: 33.1 g/dL (ref 30.0–36.0)
MCV: 83.4 fL (ref 80.0–100.0)
Platelets: 354 10*3/uL (ref 150–400)
RBC: 5.36 MIL/uL (ref 4.22–5.81)
RDW: 13.7 % (ref 11.5–15.5)
WBC: 6.7 10*3/uL (ref 4.0–10.5)
nRBC: 0 % (ref 0.0–0.2)

## 2022-06-20 LAB — BASIC METABOLIC PANEL
Anion gap: 10 (ref 5–15)
BUN: 24 mg/dL — ABNORMAL HIGH (ref 6–20)
CO2: 23 mmol/L (ref 22–32)
Calcium: 10.2 mg/dL (ref 8.9–10.3)
Chloride: 103 mmol/L (ref 98–111)
Creatinine, Ser: 1.54 mg/dL — ABNORMAL HIGH (ref 0.61–1.24)
GFR, Estimated: 53 mL/min — ABNORMAL LOW (ref >60)
Glucose, Bld: 94 mg/dL (ref 70–99)
Potassium: 4 mmol/L (ref 3.5–5.1)
Sodium: 136 mmol/L (ref 135–145)

## 2022-06-20 LAB — TROPONIN I (HIGH SENSITIVITY)
Troponin I (High Sensitivity): 9 ng/L (ref <18)
Troponin I (High Sensitivity): 9 ng/L (ref <18)

## 2022-06-20 MED ORDER — QULIPTA 60 MG PO TABS: 1.0000 | ORAL_TABLET | Freq: Every day | ORAL | 1 refills | Status: DC

## 2022-06-20 MED ORDER — SODIUM CHLORIDE 0.9 % IV BOLUS
1000.0000 mL | Freq: Once | INTRAVENOUS | Status: AC
  Administered 2022-06-20: 1000 mL via INTRAVENOUS

## 2022-06-20 NOTE — ED Notes (Signed)
Pt tolerated PO intake

## 2022-06-20 NOTE — ED Provider Notes (Signed)
[facility] Provider Note   CSN: [id number] Arrival date & time: 06/20/22  1939     History  Chief Complaint  Patient presents with   Chest Pain    Louis Cardenas is a 57 y.o. male history of previous MI status post stent, hypertension, here presenting with chest pain.  Patient states that he has been running fever for the last several days.  He states that he was febrile 102 several days ago and improved.  Patient has intermittent dizziness and chest pain.  He actually had a negative MRI this morning.  This evening around 7 PM he had an episode of substernal chest pain that lasted 10 minutes.  No nitro was given and spontaneously resolved.  He states that he had an MI several years ago in [place].  He had 1 stent placed at that time.  He is compliant with his Plavix.  He states that this is not similar to his previous MI but he just wants to be sure.  The history is provided by the patient.       Home Medications Prior to Admission medications   Medication Sig Start Date End Date Taking? Authorizing Provider  Atogepant (QULIPTA) 60 MG TABS Take 1 tablet (60 mg total) by mouth daily. 06/20/22   Janith Lima, MD  aspirin 81 MG chewable tablet Chew by mouth daily. Patient not taking: Reported on 05/26/2022    [provider]  atorvastatin (LIPITOR) 40 MG tablet Take 1 tablet (40 mg total) by mouth daily. 03/04/22   Minus Breeding, MD  ciclopirox (LOPROX) 0.77 % SUSP Apply topically daily as needed. 11/17/20   [provider]  clonazePAM (KLONOPIN) 0.5 MG tablet Take 0.5 mg by mouth as needed for anxiety.    [provider]  clopidogrel (PLAVIX) 75 MG tablet TAKE 1 TABLET BY MOUTH EVERY DAY 05/20/22   Janith Lima, MD  desonide (DESOWEN) 0.05 % lotion Apply topically daily as needed. 11/17/20   [provider]  diclofenac Sodium (VOLTAREN) 1 % GEL Apply 4 g topically 4 (four) times daily. 10/02/19    Masoudi, Elhamalsadat, MD  hydrocortisone (ANUSOL-HC) 2.5 % rectal cream Place 1 application rectally 2 (two) times daily. 01/29/21   Thornton Park, MD  hydrOXYzine (ATARAX/VISTARIL) 25 MG tablet Take 25 mg by mouth 2 (two) times daily as needed. 11/12/20   [provider]  lamoTRIgine (LAMICTAL) 200 MG tablet Take 200 mg by mouth daily.    [provider]  levocetirizine (XYZAL) 5 MG tablet TAKE 1 TABLET BY MOUTH EVERY DAY IN THE EVENING 04/26/22   Janith Lima, MD  lithium 300 MG tablet Take 300 mg by mouth daily.     [provider]  nitroGLYCERIN (NITROSTAT) 0.4 MG SL tablet Place 0.4 mg under the tongue every 5 (five) minutes as needed for chest pain.    [provider]  pantoprazole (PROTONIX) 40 MG tablet Take 1 tablet (40 mg total) by mouth 2 (two) times daily before a meal. 09/08/21   Janith Lima, MD  sildenafil (REVATIO) 20 MG tablet Take 4 tablets (80 mg total) by mouth daily as needed. 05/11/22   Janith Lima, MD  testosterone (ANDROGEL) 50 MG/5GM (1%) GEL Place 5 g onto the skin daily. 05/18/22   Janith Lima, MD  TRINTELLIX 10 MG TABS tablet Take 10 mg by mouth daily. 08/27/20   [provider]      Allergies  Patient has no known allergies.    Review of Systems   Review of Systems  Cardiovascular:  Positive for chest pain.  All other systems reviewed and are negative.   Physical Exam Updated Vital Signs BP 121/75 (BP Location: Right Arm)   Pulse (!) 50   Temp 98.7 F (37.1 C) (Oral)   Resp 19   Ht 5' 5.5" (1.664 m)   Wt 61.2 kg   SpO2 99%   BMI 22.12 kg/m  Physical Exam Vitals and nursing note reviewed.  Constitutional:      Comments: Comfortable and not in acute distress  HENT:     Head: Normocephalic.  Eyes:     Extraocular Movements: Extraocular movements intact.     Pupils: Pupils are equal, round, and reactive to light.  Cardiovascular:     Rate and Rhythm: Normal rate and regular rhythm.     Heart  sounds: Normal heart sounds.  Pulmonary:     Effort: Pulmonary effort is normal.     Breath sounds: Normal breath sounds.  Abdominal:     General: Bowel sounds are normal.     Palpations: Abdomen is soft.  Musculoskeletal:        General: Normal range of motion.     Cervical back: Normal range of motion and neck supple.  Skin:    General: Skin is warm.     Capillary Refill: Capillary refill takes less than 2 seconds.  Neurological:     General: No focal deficit present.     Mental Status: He is alert and oriented to person, place, and time.  Psychiatric:        Mood and Affect: Mood normal.        Behavior: Behavior normal.     ED Results / Procedures / Treatments   Labs (all labs ordered are listed, but only abnormal results are displayed) Labs Reviewed  BASIC METABOLIC PANEL - Abnormal; Notable for the following components:      Result Value   BUN 24 (*)    Creatinine, Ser 1.54 (*)    GFR, Estimated 53 (*)    All other components within normal limits  CBC  TROPONIN I (HIGH SENSITIVITY)  TROPONIN I (HIGH SENSITIVITY)    EKG EKG Interpretation  Date/Time:  Monday June 20 2022 20:55:24 EST Ventricular Rate:  47 PR Interval:  205 QRS Duration: 102 QT Interval:  453 QTC Calculation: 401 R Axis:   75 Text Interpretation: Sinus bradycardia RSR' in V1 or V2, probably normal variant ST elev, probable normal early repol pattern No significant change since last tracing Confirmed by Wandra Arthurs [id number]) on 06/20/2022 9:16:25 PM  Radiology DG Chest 1 View  Result Date: 06/20/2022 CLINICAL DATA:  Chest pain EXAM: CHEST  1 VIEW COMPARISON:  Chest x-ray 02/21/2019 FINDINGS: The heart size and mediastinal contours are within normal limits. Both lungs are clear. The visualized skeletal structures are unremarkable. IMPRESSION: No active disease. Electronically Signed   By: Ronney Asters M.D.   On: 06/20/2022 20:34   MR Brain Wo Contrast  Result Date: 06/20/2022 CLINICAL  DATA:  Provided history: Worst headache of life. Headache, new onset. Additional history provided by the scanning technologist: The patient reports headaches for 6 months. EXAM: MRI HEAD WITHOUT CONTRAST TECHNIQUE: Multiplanar, multiecho pulse sequences of the brain and surrounding structures were obtained without intravenous contrast. COMPARISON:  Brain MRI 09/22/2020. FINDINGS: Brain: Cerebral volume is normal. No cortical encephalomalacia is identified. No significant cerebral white matter disease  for age. There is no acute infarct. No evidence of an intracranial mass. No chronic intracranial blood products. No extra-axial fluid collection. No midline shift. Vascular: Maintained flow voids within the proximal large arterial vessels. Skull and upper cervical spine: No focal suspicious marrow lesion. Sinuses/Orbits: No mass or acute finding within the imaged orbits. Mild mucosal thickening within the bilateral frontal sinuses. Mucosal thickening within bilateral ethmoid air cells, overall moderate to severe. Mild mucosal thickening within the bilateral sphenoid sinuses. Moderate mucosal thickening within the bilateral maxillary sinuses. IMPRESSION: 1. Unremarkable non-contrast MRI appearance of the brain for age. No evidence of an acute intracranial abnormality. 2. Paranasal sinus disease, as described. Electronically Signed   By: Kellie Simmering D.O.   On: 06/20/2022 08:21    Procedures Procedures    Medications Ordered in ED Medications  sodium chloride 0.9 % bolus 1,000 mL (1,000 mLs Intravenous New Bag/Given 06/20/22 2154)    ED Course/ Medical Decision Making/ A&P                             Medical Decision Making Louis Cardenas is a 57 y.o. male here presenting with chest pain.  Patient also has been having dizziness and viral syndrome for several days.  Had a negative MRI earlier today.  Patient does have a history of CAD with 1 stent.  He states that this is not similar to his previous MI.   Plan to get CBC and BMP and troponin x 2.  Offered admission for more workup but patient states that he would like to go home if troponin negative.  11:08 PM Reviewed patient's labs independently interpreted the imaging studies.  Patient's initial troponin was 9 and second troponin remained flat 9.  He had mild AKI with creatinine 1.5.  Given IV fluids and felt better.  Patient had a negative MRI brain this morning.  I think likely postviral syndrome.  Chest x-ray did not show pneumonia.  Patient at this point is stable for discharge.  Problems Addressed: Chest pain, unspecified type: acute illness or injury Dehydration: acute illness or injury Post viral syndrome: acute illness or injury  Amount and/or Complexity of Data Reviewed Labs: ordered. Decision-making details documented in ED Course. Radiology: ordered and independent interpretation performed. Decision-making details documented in ED Course.    Final Clinical Impression(s) / ED Diagnoses Final diagnoses:  None    Rx / DC Orders ED Discharge Orders     None         Drenda Freeze, MD 06/20/22 2310

## 2022-06-20 NOTE — ED Triage Notes (Signed)
Pt c/o left chest "piercing pain" 8/10 after getting up today.  Pt reports of several symptoms over the past couple of days including dizziness, feeling flush and different aches.  Denies SOB, N/V/D.  Hx of cardiac treatments and smoking.

## 2022-06-20 NOTE — ED Notes (Signed)
Patient verbalizes understanding of discharge instructions. Opportunity for questioning and answers were provided. Armband removed by staff, pt discharged from ED. Pt ambulatory to ED waiting room with steady gait.  

## 2022-06-20 NOTE — ED Provider Triage Note (Addendum)
Emergency Medicine Provider Triage Evaluation Note  Louis Cardenas , a 57 y.o. male  was evaluated in triage.  Pt complains of intermittent chest pain onset 7 PM tonight. Notes that he got out of bed when he noticed his chest pain to the sternal region. Notes that his pain resolved at this time. When he was having his chest pain it was 8/10 and lasted approximately 10-15 minutes. No NTG taken PTA. No chest pain with inspiration or position changes. History of stent placement. Does have a cardiologist. Chest pain is 1-2/10 at this time. Has diaphoresis and flushed feeling that was similar to his previous MI, however, resolved at this time. Denies nausea or vomiting.   Review of Systems  Positive:  Negative:   Physical Exam  BP 133/82 (BP Location: Right Arm)   Pulse 62   Temp 98.5 F (36.9 C) (Oral)   Resp 18   SpO2 96%  Gen:   Awake, no distress   Resp:  Normal effort  MSK:   Moves extremities without difficulty  Other:  No chest wall TTP.   Medical Decision Making  Medically screening exam initiated at 7:49 PM.  Appropriate orders placed.  Deondrick Haseley was informed that the remainder of the evaluation will be completed by another provider, this initial triage assessment does not replace that evaluation, and the importance of remaining in the ED until their evaluation is complete.  Work-up initiated.    Dio Giller A, PA-C 06/20/22 2000  8:00 PM - Discussed with RN that patient is in need of a room immediately. RN aware and working on room placement.     Kelsen Celona A, PA-C 06/20/22 2000

## 2022-06-20 NOTE — Discharge Instructions (Addendum)
As we discussed, you are slightly dehydrated and most likely had a postviral syndrome.  Your heart enzyme tests are normal right now.  Please call cardiology tomorrow regarding follow-up.  Return to ER if you have worse chest pain or dizziness or shortness of breath

## 2022-06-21 ENCOUNTER — Encounter: Payer: Self-pay | Admitting: Internal Medicine

## 2022-06-21 ENCOUNTER — Encounter: Payer: Self-pay | Admitting: Cardiology

## 2022-06-21 ENCOUNTER — Telehealth: Payer: Self-pay

## 2022-06-21 NOTE — Transitions of Care (Post Inpatient/ED Visit) (Signed)
   06/21/2022  Name: Louis Cardenas MRN: [id number] DOB: 05/26/1965  Today's TOC FU Call Status: Today's TOC FU Call Status:: Unsuccessul Call (1st Attempt) Unsuccessful Call (1st Attempt) Date: 06/21/22  Attempted to reach the patient regarding the most recent Inpatient/ED visit.  Follow Up Plan: Additional outreach attempts will be made to reach the patient to complete the Transitions of Care (Post Inpatient/ED visit) call.   Signature Juanda Crumble, [place] Direct Dial [phone]

## 2022-06-21 NOTE — Transitions of Care (Post Inpatient/ED Visit) (Signed)
   06/21/2022  Name: Louis Cardenas MRN: [id number] DOB: 1965-08-20  Today's TOC FU Call Status: Today's TOC FU Call Status:: Successful TOC FU Call Competed Unsuccessful Call (1st Attempt) Date: 06/21/22 [facility] FU Call Complete Date: 06/21/22  Transition Care Management Follow-up Telephone Call Date of Discharge: 06/20/22 Discharge Facility[facility]) Type of Discharge: Emergency Department Reason for ED Visit: Other: (dehydration) How have you been since you were released from the hospital?: Better Any questions or concerns?: No  Items Reviewed: Did you receive and understand the discharge instructions provided?: Yes Medications obtained and verified?: Yes (Medications Reviewed) Any new allergies since your discharge?: No Dietary orders reviewed?: NA Do you have support at home?: Yes People in Home: spouse  Home Care and Equipment/Supplies: [place] Ordered?: NA Any new equipment or medical supplies ordered?: NA  Functional Questionnaire: Do you need assistance with bathing/showering or dressing?: No Do you need assistance with meal preparation?: No Do you need assistance with eating?: No Do you have difficulty maintaining continence: No Do you need assistance with getting out of bed/getting out of a chair/moving?: No Do you have difficulty managing or taking your medications?: No  Folllow up appointments reviewed: PCP Follow-up appointment confirmed?: NA Specialist Hospital Follow-up appointment confirmed?: Yes Date of Specialist follow-up appointment?: 07/20/22 Follow-Up Specialty Provider:: DR hoshl Do you need transportation to your follow-up appointment?: No Do you understand care options if your condition(s) worsen?: Yes-patient verbalized understanding    SIGNATURE Juanda Crumble, [place] Nurse Health Advisor Direct Dial [phone]

## 2022-06-22 ENCOUNTER — Other Ambulatory Visit (HOSPITAL_COMMUNITY): Payer: Self-pay

## 2022-06-22 ENCOUNTER — Telehealth: Payer: Self-pay

## 2022-06-22 NOTE — Telephone Encounter (Signed)
this request is approved from 06/22/2022 to 06/22/2023.

## 2022-06-22 NOTE — Telephone Encounter (Signed)
Key: [id number]

## 2022-08-19 ENCOUNTER — Other Ambulatory Visit: Payer: Self-pay | Admitting: Internal Medicine

## 2022-08-19 DIAGNOSIS — I2510 Atherosclerotic heart disease of native coronary artery without angina pectoris: Secondary | ICD-10-CM

## 2022-09-29 ENCOUNTER — Ambulatory Visit
Admission: RE | Admit: 2022-09-29 | Discharge: 2022-09-29 | Disposition: A | Discharge status: Home / Self Care | Payer: BC Managed Care – PPO | Source: Ambulatory Visit | Attending: Acute Care | Admitting: Acute Care

## 2022-09-29 DIAGNOSIS — Z87891 Personal history of nicotine dependence: Secondary | ICD-10-CM

## 2022-09-29 DIAGNOSIS — F17210 Nicotine dependence, cigarettes, uncomplicated: Secondary | ICD-10-CM

## 2022-09-29 DIAGNOSIS — Z122 Encounter for screening for malignant neoplasm of respiratory organs: Secondary | ICD-10-CM

## 2022-10-04 ENCOUNTER — Other Ambulatory Visit: Payer: Self-pay | Admitting: Internal Medicine

## 2022-10-04 DIAGNOSIS — E291 Testicular hypofunction: Secondary | ICD-10-CM

## 2022-10-05 ENCOUNTER — Encounter: Payer: Self-pay | Admitting: Cardiology

## 2022-10-06 ENCOUNTER — Other Ambulatory Visit: Payer: Self-pay

## 2022-10-06 DIAGNOSIS — Z122 Encounter for screening for malignant neoplasm of respiratory organs: Secondary | ICD-10-CM

## 2022-10-06 DIAGNOSIS — F17210 Nicotine dependence, cigarettes, uncomplicated: Secondary | ICD-10-CM

## 2022-10-06 DIAGNOSIS — Z87891 Personal history of nicotine dependence: Secondary | ICD-10-CM

## 2022-10-11 ENCOUNTER — Other Ambulatory Visit: Payer: Self-pay | Admitting: Internal Medicine

## 2022-10-11 DIAGNOSIS — E291 Testicular hypofunction: Secondary | ICD-10-CM

## 2022-11-10 ENCOUNTER — Telehealth: Payer: Self-pay | Admitting: Cardiology

## 2022-11-10 NOTE — Telephone Encounter (Signed)
   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 Scarlette Ar Surgeon's Group or Practice Name:  [facility]  Phone number:  [phone] Fax number:  [phone]   Type of Clearance Requested:   - Medical  - Pharmacy:  Hold Aspirin and Clopidogrel (Plavix)     Type of Anesthesia:  General    Additional requests/questions:  Please advise surgeon/provider what medications should be held.  Signed, April Henson   11/10/2022, 10:00 AM

## 2022-11-10 NOTE — Telephone Encounter (Signed)
   Name: Louis Cardenas  DOB: October 12, 1965  MRN: [id number]  Primary Cardiologist: None   Preoperative team, please contact this patient and set up a phone call appointment for further preoperative risk assessment. Please obtain consent and complete medication review. Thank you for your help.  I confirm that guidance regarding antiplatelet and oral anticoagulation therapy has been completed and, if necessary, noted below.  From a cardiac perspective, patient may hold Aspirin for 5 to 7 days prior to procedure and Plavix for 5 days prior to procedure. However, patient also takes Plavix for history of CVA. Final recommendations for holding Plavix prior to procedure should come from managing provider (PCP).    Joylene Grapes, NP 11/10/2022, 11:35 AM [facility]

## 2022-11-10 NOTE — Telephone Encounter (Signed)
Lmtrc to make clearance appointment.Marland Kitchen

## 2022-11-11 ENCOUNTER — Telehealth: Payer: Self-pay

## 2022-11-11 NOTE — Telephone Encounter (Signed)
  Patient Consent for Virtual Visit        Cher Egnor has provided verbal consent on 11/11/2022 for a virtual visit (video or telephone).   CONSENT FOR VIRTUAL VISIT FOR:  Tona Sensing  By participating in this virtual visit I agree to the following:  I hereby voluntarily request, consent and authorize [facility] and its employed or contracted physicians, physician assistants, nurse practitioners or other licensed health care professionals (the Practitioner), to provide me with telemedicine health care services (the "Services") as deemed necessary by the treating Practitioner. I acknowledge and consent to receive the Services by the Practitioner via telemedicine. I understand that the telemedicine visit will involve communicating with the Practitioner through live audiovisual communication technology and the disclosure of certain medical information by electronic transmission. I acknowledge that I have been given the opportunity to request an in-person assessment or other available alternative prior to the telemedicine visit and am voluntarily participating in the telemedicine visit.  I understand that I have the right to withhold or withdraw my consent to the use of telemedicine in the course of my care at any time, without affecting my right to future care or treatment, and that the Practitioner or I may terminate the telemedicine visit at any time. I understand that I have the right to inspect all information obtained and/or recorded in the course of the telemedicine visit and may receive copies of available information for a reasonable fee.  I understand that some of the potential risks of receiving the Services via telemedicine include:  Delay or interruption in medical evaluation due to technological equipment failure or disruption; Information transmitted may not be sufficient (e.g. poor resolution of images) to allow for appropriate medical decision making by the  Practitioner; and/or  In rare instances, security protocols could fail, causing a breach of personal health information.  Furthermore, I acknowledge that it is my responsibility to provide information about my medical history, conditions and care that is complete and accurate to the best of my ability. I acknowledge that Practitioner's advice, recommendations, and/or decision may be based on factors not within their control, such as incomplete or inaccurate data provided by me or distortions of diagnostic images or specimens that may result from electronic transmissions. I understand that the practice of medicine is not an exact science and that Practitioner makes no warranties or guarantees regarding treatment outcomes. I acknowledge that a copy of this consent can be made available to me via my patient portal [facility] MyChart), or I can request a printed copy by calling the office of [facility].    I understand that my insurance will be billed for this visit.   I have read or had this consent read to me. I understand the contents of this consent, which adequately explains the benefits and risks of the Services being provided via telemedicine.  I have been provided ample opportunity to ask questions regarding this consent and the Services and have had my questions answered to my satisfaction. I give my informed consent for the services to be provided through the use of telemedicine in my medical care

## 2022-11-11 NOTE — Telephone Encounter (Signed)
Patient pre-op televisit scheduled. Med rec and consent provided.

## 2022-11-15 ENCOUNTER — Other Ambulatory Visit: Payer: Self-pay | Admitting: Internal Medicine

## 2022-11-15 DIAGNOSIS — I2510 Atherosclerotic heart disease of native coronary artery without angina pectoris: Secondary | ICD-10-CM

## 2022-11-24 ENCOUNTER — Ambulatory Visit: Payer: BC Managed Care – PPO | Attending: Cardiology | Admitting: Student

## 2022-11-24 ENCOUNTER — Other Ambulatory Visit: Payer: Self-pay | Admitting: Internal Medicine

## 2022-11-24 DIAGNOSIS — Z01810 Encounter for preprocedural cardiovascular examination: Secondary | ICD-10-CM | POA: Diagnosis not present

## 2022-11-24 DIAGNOSIS — I2510 Atherosclerotic heart disease of native coronary artery without angina pectoris: Secondary | ICD-10-CM

## 2022-11-24 NOTE — Progress Notes (Signed)
Virtual Visit via Telephone Note   Because of Louis Cardenas's co-morbid illnesses, he is at least at moderate risk for complications without adequate follow up.  This format is felt to be most appropriate for this patient at this time.  The patient did not have access to video technology/had technical difficulties with video requiring transitioning to audio format only (telephone).  All issues noted in this document were discussed and addressed.  No physical exam could be performed with this format.  Please refer to the patient's chart for his consent to telehealth for [facility].  Evaluation Performed:  Preoperative cardiovascular risk assessment _____________   Date:  11/24/2022   Patient ID:  Louis Cardenas, DOB May 23, 1965, MRN [id number] Patient Location:  Home Provider location:   Office  Primary Care Provider:  Etta Grandchild, MD Primary Cardiologist:  None  Chief Complaint / Patient Profile   57 y.o. y/o male with a h/o CAD s/p PCI with stent to LAD 2011, hyperlipidemia, ED, GERD, tobacco abuse who is pending cytoplasm turbinate reduction and endoscopic sinus surgery by Dr. Ernestene Kiel and presents today for telephonic preoperative cardiovascular risk assessment.  History of Present Illness    Louis Cardenas is a 56 y.o. male who presents via audio/video conferencing for a telehealth visit today.  Pt was last seen in cardiology clinic on 01/07/2022 by Dr. Antoine Poche.  At that time Rafeeq Bach was doing well.  The patient is now pending procedure as outlined above. Since his last visit, he is doing well. Patient denies shortness of breath or dyspnea on exertion. No chest pain, pressure, or tightness. Denies lower extremity edema, orthopnea, or PND. No palpitations. He was going to the gym for exercise until a few months ago when he began spending his free time renovating his mom's house. He does not experience any chest pain or shortness of breath with any of these  activities.   Past Medical History    Past Medical History:  Diagnosis Date   Anxiety    Bipolar 1 disorder (HCC)    CAD (coronary artery disease)    Depressed    Eating disorder    bulimia   Family history of colon cancer in father    GERD (gastroesophageal reflux disease)    History of colon polyps    Hypertension    Hypogonadism in male    MI (myocardial infarction) (HCC)    Stroke (HCC)    Thyroid nodule    Past Surgical History:  Procedure Laterality Date   APPENDECTOMY  1998   COLONOSCOPY     CORONARY ANGIOPLASTY WITH STENT PLACEMENT     ROTATOR CUFF REPAIR Left    with screws   UPPER GASTROINTESTINAL ENDOSCOPY      Allergies  No Known Allergies  Home Medications    Prior to Admission medications   Medication Sig Start Date End Date Taking? Authorizing Provider  aspirin 81 MG chewable tablet Chew by mouth daily.    [provider]  Atogepant (QULIPTA) 60 MG TABS Take 1 tablet (60 mg total) by mouth daily. 06/20/22   Etta Grandchild, MD  atorvastatin (LIPITOR) 40 MG tablet Take 1 tablet (40 mg total) by mouth daily. 03/04/22   Rollene Rotunda, MD  ciclopirox (LOPROX) 0.77 % SUSP Apply topically daily as needed. 11/17/20   [provider]  clonazePAM (KLONOPIN) 0.5 MG tablet Take 0.5 mg by mouth as needed for anxiety.    [provider]  clopidogrel (PLAVIX) 75 MG tablet  TAKE 1 TABLET BY MOUTH EVERY DAY 08/19/22   Etta Grandchild, MD  desonide (DESOWEN) 0.05 % lotion Apply topically daily as needed. 11/17/20   [provider]  diclofenac Sodium (VOLTAREN) 1 % GEL Apply 4 g topically 4 (four) times daily. 10/02/19   Masoudi, Elhamalsadat, MD  hydrocortisone (ANUSOL-HC) 2.5 % rectal cream Place 1 application rectally 2 (two) times daily. 01/29/21   Tressia Danas, MD  hydrOXYzine (ATARAX/VISTARIL) 25 MG tablet Take 25 mg by mouth 2 (two) times daily as needed. 11/12/20   [provider]  lamoTRIgine (LAMICTAL) 200 MG tablet  Take 200 mg by mouth daily.    [provider]  levocetirizine (XYZAL) 5 MG tablet TAKE 1 TABLET BY MOUTH EVERY DAY IN THE EVENING 04/26/22   Etta Grandchild, MD  lithium 300 MG tablet Take 300 mg by mouth daily.     [provider]  nitroGLYCERIN (NITROSTAT) 0.4 MG SL tablet Place 0.4 mg under the tongue every 5 (five) minutes as needed for chest pain.    [provider]  pantoprazole (PROTONIX) 40 MG tablet Take 1 tablet (40 mg total) by mouth 2 (two) times daily before a meal. 09/08/21   Etta Grandchild, MD  sildenafil (REVATIO) 20 MG tablet Take 4 tablets (80 mg total) by mouth daily as needed. 05/11/22   Etta Grandchild, MD  testosterone (ANDROGEL) 50 MG/5GM (1%) GEL PLACE 5GRAMS ONTO THE SKIN DAILY. 10/04/22   Etta Grandchild, MD  TRINTELLIX 10 MG TABS tablet Take 10 mg by mouth daily. 08/27/20   [provider]    Physical Exam    Vital Signs:  Hewlett Barrueta does not have vital signs available for review today.  Given telephonic nature of communication, physical exam is limited. AAOx3. NAD. Normal affect.  Speech and respirations are unlabored.  Accessory Clinical Findings    None  Assessment & Plan    Primary Cardiologist: None  Preoperative cardiovascular risk assessment.  Cytoplasm turbinate reduction and endoscopic sinus surgery by Dr. Ernestene Kiel on 12/14/2022.  Chart reviewed as part of pre-operative protocol coverage. According to the RCRI, patient has a 6.6% risk of MACE. Patient reports activity equivalent to >4.0 METS (renovating a house).   Given past medical history and time since last visit, based on ACC/AHA guidelines, Christohper Carroway would be at acceptable risk for the planned procedure without further cardiovascular testing.   Patient was advised that if he develops new symptoms prior to surgery to contact our office to arrange a follow-up appointment.  he verbalized understanding.  From a cardiac perspective, patient may hold Aspirin  for 5 to 7 days prior to procedure and Plavix for 5 days prior to procedure. However, patient also takes Plavix for history of CVA. Final recommendations for holding Plavix prior to procedure should come from managing provider (PCP).   I will route this recommendation to the requesting party via Epic fax function.  Please call with questions.  Time:   Today, I have spent 5 minutes with the patient with telehealth technology discussing medical history, symptoms, and management plan.     Carlos Levering, NP  11/24/2022, 8:18 AM

## 2022-11-25 ENCOUNTER — Other Ambulatory Visit: Payer: Self-pay

## 2022-11-25 ENCOUNTER — Encounter: Payer: Self-pay | Admitting: Internal Medicine

## 2022-11-25 ENCOUNTER — Encounter: Payer: Self-pay | Admitting: Cardiology

## 2022-11-25 ENCOUNTER — Other Ambulatory Visit: Payer: Self-pay | Admitting: Internal Medicine

## 2022-11-25 DIAGNOSIS — I2510 Atherosclerotic heart disease of native coronary artery without angina pectoris: Secondary | ICD-10-CM

## 2022-11-25 MED ORDER — NITROGLYCERIN 0.4 MG SL SUBL: 0.4000 mg | SUBLINGUAL_TABLET | SUBLINGUAL | 0 refills | Status: DC | PRN

## 2022-11-26 ENCOUNTER — Other Ambulatory Visit: Payer: Self-pay | Admitting: Internal Medicine

## 2022-11-26 DIAGNOSIS — I2510 Atherosclerotic heart disease of native coronary artery without angina pectoris: Secondary | ICD-10-CM

## 2022-11-28 ENCOUNTER — Other Ambulatory Visit: Payer: Self-pay | Admitting: Internal Medicine

## 2022-11-28 DIAGNOSIS — K2101 Gastro-esophageal reflux disease with esophagitis, with bleeding: Secondary | ICD-10-CM

## 2022-11-28 DIAGNOSIS — I2510 Atherosclerotic heart disease of native coronary artery without angina pectoris: Secondary | ICD-10-CM

## 2022-12-01 ENCOUNTER — Encounter: Payer: Self-pay | Admitting: Internal Medicine

## 2022-12-01 ENCOUNTER — Ambulatory Visit: Payer: BC Managed Care – PPO | Admitting: Internal Medicine

## 2022-12-01 VITALS — BP 128/78 | HR 65 | Temp 98.0°F | Resp 16 | Ht 65.5 in | Wt 140.0 lb

## 2022-12-01 DIAGNOSIS — K2101 Gastro-esophageal reflux disease with esophagitis, with bleeding: Secondary | ICD-10-CM | POA: Diagnosis not present

## 2022-12-01 DIAGNOSIS — N1831 Chronic kidney disease, stage 3a: Secondary | ICD-10-CM | POA: Diagnosis not present

## 2022-12-01 DIAGNOSIS — F319 Bipolar disorder, unspecified: Secondary | ICD-10-CM | POA: Diagnosis not present

## 2022-12-01 DIAGNOSIS — E291 Testicular hypofunction: Secondary | ICD-10-CM

## 2022-12-01 DIAGNOSIS — Z5181 Encounter for therapeutic drug level monitoring: Secondary | ICD-10-CM

## 2022-12-01 DIAGNOSIS — E785 Hyperlipidemia, unspecified: Secondary | ICD-10-CM | POA: Diagnosis not present

## 2022-12-01 LAB — URINALYSIS, ROUTINE W REFLEX MICROSCOPIC
Bilirubin Urine: NEGATIVE
Hgb urine dipstick: NEGATIVE
Ketones, ur: NEGATIVE
Leukocytes,Ua: NEGATIVE
Nitrite: NEGATIVE
RBC / HPF: NONE SEEN (ref 0–?)
Specific Gravity, Urine: 1.01 (ref 1.000–1.030)
Total Protein, Urine: NEGATIVE
Urine Glucose: NEGATIVE
Urobilinogen, UA: 0.2 (ref 0.0–1.0)
pH: 7.5 (ref 5.0–8.0)

## 2022-12-01 LAB — LIPID PANEL
Cholesterol: 118 mg/dL (ref 0–200)
HDL: 46.1 mg/dL (ref >39.00)
LDL Cholesterol: 63 mg/dL (ref 0–99)
NonHDL: 71.87
Total CHOL/HDL Ratio: 3
Triglycerides: 43 mg/dL (ref 0.0–149.0)
VLDL: 8.6 mg/dL (ref 0.0–40.0)

## 2022-12-01 LAB — CBC WITH DIFFERENTIAL/PLATELET
Basophils Absolute: 0.1 10*3/uL (ref 0.0–0.1)
Basophils Relative: 2.2 % (ref 0.0–3.0)
Eosinophils Absolute: 0.3 10*3/uL (ref 0.0–0.7)
Eosinophils Relative: 5.2 % — ABNORMAL HIGH (ref 0.0–5.0)
HCT: 43.9 % (ref 39.0–52.0)
Hemoglobin: 14.2 g/dL (ref 13.0–17.0)
Lymphocytes Relative: 18 % (ref 12.0–46.0)
Lymphs Abs: 1.2 10*3/uL (ref 0.7–4.0)
MCHC: 32.4 g/dL (ref 30.0–36.0)
MCV: 87.8 fl (ref 78.0–100.0)
Monocytes Absolute: 0.6 10*3/uL (ref 0.1–1.0)
Monocytes Relative: 9.7 % (ref 3.0–12.0)
Neutro Abs: 4.2 10*3/uL (ref 1.4–7.7)
Neutrophils Relative %: 64.9 % (ref 43.0–77.0)
Platelets: 286 10*3/uL (ref 150.0–400.0)
RBC: 5 Mil/uL (ref 4.22–5.81)
RDW: 15.6 % — ABNORMAL HIGH (ref 11.5–15.5)
WBC: 6.5 10*3/uL (ref 4.0–10.5)

## 2022-12-01 LAB — BASIC METABOLIC PANEL
BUN: 18 mg/dL (ref 6–23)
CO2: 27 mEq/L (ref 19–32)
Calcium: 10.1 mg/dL (ref 8.4–10.5)
Chloride: 108 mEq/L (ref 96–112)
Creatinine, Ser: 1.44 mg/dL (ref 0.40–1.50)
GFR: 54.76 mL/min — ABNORMAL LOW (ref >60.00)
Glucose, Bld: 102 mg/dL — ABNORMAL HIGH (ref 70–99)
Potassium: 4.3 mEq/L (ref 3.5–5.1)
Sodium: 141 mEq/L (ref 135–145)

## 2022-12-01 LAB — HEPATIC FUNCTION PANEL
ALT: 17 U/L (ref 0–53)
AST: 17 U/L (ref 0–37)
Albumin: 4.6 g/dL (ref 3.5–5.2)
Alkaline Phosphatase: 89 U/L (ref 39–117)
Bilirubin, Direct: 0.1 mg/dL (ref 0.0–0.3)
Total Bilirubin: 0.4 mg/dL (ref 0.2–1.2)
Total Protein: 6.5 g/dL (ref 6.0–8.3)

## 2022-12-01 NOTE — Progress Notes (Signed)
Subjective:  Patient ID: Louis Cardenas, male    DOB: [birth year]  Age: 57 y.o. MRN: [id number]  CC: Depression and Coronary Artery Disease   HPI Louis Cardenas presents for f/up -----  Discussed the use of AI scribe software for clinical note transcription with the patient, who gave verbal consent to proceed.  History of Present Illness   The patient, with a history of multiple losses in the family, presents with a recent traumatic injury at a local store. He reports a box fell on him, causing a laceration that bled profusely for three days. The patient also mentions widespread bruising, but it is unclear if this is related to the recent trauma or a separate issue.  In the midst of these health concerns, the patient has been dealing with significant life stressors. He has been managing the estate of his recently deceased mother, including a contentious house renovation. He also lost a sister last October, the cause of which is unclear, and recently, his mother's best friend passed away.   The patient is scheduled for an upcoming sinus surgery and has been advised to stop certain medications five days prior to the procedure. His cardiac health was recently evaluated and deemed satisfactory.       Outpatient Medications Prior to Visit  Medication Sig Dispense Refill   aspirin 81 MG chewable tablet Chew by mouth daily.     atorvastatin (LIPITOR) 40 MG tablet Take 1 tablet (40 mg total) by mouth daily. 90 tablet 3   clonazePAM (KLONOPIN) 0.5 MG tablet Take 0.5 mg by mouth as needed for anxiety.     clopidogrel (PLAVIX) 75 MG tablet TAKE 1 TABLET BY MOUTH EVERY DAY 90 tablet 0   desonide (DESOWEN) 0.05 % lotion Apply topically daily as needed.     hydrocortisone (ANUSOL-HC) 2.5 % rectal cream Place 1 application rectally 2 (two) times daily. 30 g 1   hydrOXYzine (ATARAX/VISTARIL) 25 MG tablet Take 25 mg by mouth 2 (two) times daily as needed.     lamoTRIgine (LAMICTAL) 200 MG  tablet Take 200 mg by mouth daily.     levocetirizine (XYZAL) 5 MG tablet TAKE 1 TABLET BY MOUTH EVERY DAY IN THE EVENING 90 tablet 1   lithium 300 MG tablet Take 300 mg by mouth daily.      nitroGLYCERIN (NITROSTAT) 0.4 MG SL tablet Place 1 tablet (0.4 mg total) under the tongue every 5 (five) minutes as needed for chest pain. 30 tablet 0   pantoprazole (PROTONIX) 40 MG tablet TAKE 1 TABLET (40 MG TOTAL) BY MOUTH TWICE A DAY BEFORE MEALS 180 tablet 0   sildenafil (REVATIO) 20 MG tablet Take 4 tablets (80 mg total) by mouth daily as needed. 60 tablet 2   testosterone (ANDROGEL) 50 MG/5GM (1%) GEL PLACE 5GRAMS ONTO THE SKIN DAILY. 450 g 0   TRINTELLIX 10 MG TABS tablet Take 10 mg by mouth daily.     Atogepant (QULIPTA) 60 MG TABS Take 1 tablet (60 mg total) by mouth daily. 90 tablet 1   ciclopirox (LOPROX) 0.77 % SUSP Apply topically daily as needed.     diclofenac Sodium (VOLTAREN) 1 % GEL Apply 4 g topically 4 (four) times daily. 100 g 0   No facility-administered medications prior to visit.    ROS Review of Systems  Constitutional: Negative.  Negative for diaphoresis and fatigue.  HENT:  Positive for nosebleeds, postnasal drip and rhinorrhea. Negative for ear pain, sinus pain and sore throat.  Eyes: Negative.   Respiratory: Negative.  Negative for cough, chest tightness, shortness of breath and wheezing.   Cardiovascular:  Negative for chest pain, palpitations and leg swelling.  Gastrointestinal:  Negative for abdominal pain, blood in stool, constipation, diarrhea and nausea.  Genitourinary: Negative.  Negative for difficulty urinating and hematuria.  Musculoskeletal: Negative.  Negative for arthralgias.  Skin: Negative.   Neurological:  Negative for dizziness, speech difficulty and weakness.  Hematological:  Negative for adenopathy. Bruises/bleeds easily.    Objective:  BP 128/78 (BP Location: Right Arm, Patient Position: Sitting, Cuff Size: Large)   Pulse 65   Temp 98 F (36.7  C) (Oral)   Resp 16   Ht 5' 5.5" (1.664 m)   Wt 140 lb (63.5 kg)   SpO2 96%   BMI 22.94 kg/m   BP Readings from Last 3 Encounters:  12/01/22 128/78  06/20/22 130/87  05/18/22 122/72    Wt Readings from Last 3 Encounters:  12/01/22 140 lb (63.5 kg)  06/20/22 135 lb (61.2 kg)  05/18/22 150 lb (68 kg)    Physical Exam Vitals reviewed.  HENT:     Nose: Nose normal.     Mouth/Throat:     Mouth: Mucous membranes are moist.  Eyes:     General: No scleral icterus.    Conjunctiva/sclera: Conjunctivae normal.  Cardiovascular:     Rate and Rhythm: Normal rate and regular rhythm.     Heart sounds: No murmur heard. Pulmonary:     Effort: Pulmonary effort is normal.     Breath sounds: No stridor. No wheezing, rhonchi or rales.  Abdominal:     General: Abdomen is flat.     Palpations: There is no mass.     Tenderness: There is no abdominal tenderness. There is no guarding.     Hernia: No hernia is present.  Musculoskeletal:     Cervical back: Neck supple.  Lymphadenopathy:     Cervical: No cervical adenopathy.  Skin:    Coloration: Skin is not jaundiced.     Findings: Bruising present.  Neurological:     General: No focal deficit present.     Mental Status: He is alert.  Psychiatric:        Mood and Affect: Mood normal.        Behavior: Behavior normal.     Lab Results  Component Value Date   WBC 6.5 12/01/2022   HGB 14.2 12/01/2022   HCT 43.9 12/01/2022   PLT 286.0 12/01/2022   GLUCOSE 102 (H) 12/01/2022   CHOL 118 12/01/2022   TRIG 43.0 12/01/2022   HDL 46.10 12/01/2022   LDLCALC 63 12/01/2022   ALT 17 12/01/2022   AST 17 12/01/2022   NA 141 12/01/2022   K 4.3 12/01/2022   CL 108 12/01/2022   CREATININE 1.44 12/01/2022   BUN 18 12/01/2022   CO2 27 12/01/2022   TSH 0.64 05/10/2022   PSA 0.50 05/24/2022   INR 1.0 08/16/2021    CT CHEST LUNG CA SCREEN LOW DOSE W/O CM  Result Date: 10/05/2022 CLINICAL DATA:  57 year old male current smoker with 32  pack-year history of smoking. Lung cancer screening examination. EXAM: CT CHEST WITHOUT CONTRAST LOW-DOSE FOR LUNG CANCER SCREENING TECHNIQUE: Multidetector CT imaging of the chest was performed following the standard protocol without IV contrast. RADIATION DOSE REDUCTION: This exam was performed according to the departmental dose-optimization program which includes automated exposure control, adjustment of the mA and/or kV according to patient size and/or use of  iterative reconstruction technique. COMPARISON:  Low-dose lung cancer screening chest CT 09/27/2021. FINDINGS: Cardiovascular: Heart size is normal. There is no significant pericardial fluid, thickening or pericardial calcification. There is aortic atherosclerosis, as well as atherosclerosis of the great vessels of the mediastinum and the coronary arteries, including calcified atherosclerotic plaque in the left anterior descending and left circumflex coronary arteries. Mediastinum/Nodes: No pathologically enlarged mediastinal or hilar lymph nodes. Please note that accurate exclusion of hilar adenopathy is limited on noncontrast CT scans. Esophagus is unremarkable in appearance. No axillary lymphadenopathy. Lungs/Pleura: No suspicious appearing pulmonary nodules or masses are noted. No acute consolidative airspace disease. No pleural effusions. Mild diffuse bronchial wall thickening with very mild centrilobular and paraseptal emphysema. Upper Abdomen: Aortic atherosclerosis. Large low-attenuation lesion in the upper right retroperitoneum measuring 5.5 x 4.6 cm which appears to arise from the upper pole of the right kidney, and smaller partially exophytic 1.6 cm low-attenuation lesion in the upper pole of the left kidney, both incompletely characterized on today's noncontrast CT examination, but similar to the prior study and statistically likely cysts (no imaging follow-up recommended). Musculoskeletal: There are no aggressive appearing lytic or blastic  lesions noted in the visualized portions of the skeleton. IMPRESSION: 1. Lung-RADS 1S, negative. Continue annual screening with low-dose chest CT without contrast in 12 months. 2. The "S" modifier above refers to potentially clinically significant non lung cancer related findings. Specifically, there is aortic atherosclerosis, in addition to two-vessel coronary artery disease. Please note that although the presence of coronary artery calcium documents the presence of coronary artery disease, the severity of this disease and any potential stenosis cannot be assessed on this non-gated CT examination. Assessment for potential risk factor modification, dietary therapy or pharmacologic therapy may be warranted, if clinically indicated. 3. Mild diffuse bronchial wall thickening with very mild centrilobular and paraseptal emphysema; imaging findings suggestive of underlying COPD. Aortic Atherosclerosis (ICD10-I70.0) and Emphysema (ICD10-J43.9). Electronically Signed   By: Trudie Reed M.D.   On: 10/05/2022 13:17   Assessment & Plan:   Hyperlipidemia with target LDL less than 70 - LDL goal achieved. Doing well on the statin  -     Lipid panel; Future -     Hepatic function panel; Future  Hypogonadism male -     CBC with Differential/Platelet; Future  Gastroesophageal reflux disease with esophagitis and hemorrhage -     CBC with Differential/Platelet; Future  Bipolar 1 disorder (HCC) -     Lamotrigine level; Future -     Lithium level; Future  Therapeutic drug monitoring- I will monitor his lamotrigine and lithium levels. -     Lamotrigine level; Future -     Lithium level; Future  Stage 3a chronic kidney disease (HCC)- His renal function is stable. -     Basic metabolic panel; Future -     Urinalysis, Routine w reflex microscopic; Future     Follow-up: Return in about 6 months (around 06/03/2023).  Sanda Linger, MD

## 2022-12-01 NOTE — Patient Instructions (Signed)
Coronary Artery Disease, Male Coronary artery disease (CAD) is a condition in which the arteries that lead to the heart (coronary arteries) become narrow or blocked. The narrowing or blockage can lead to decreased blood flow to the heart. Prolonged reduced blood flow can cause a heart attack (myocardial infarction, or MI). This condition may also be called coronary heart disease. CAD is the most common type of heart disease, and heart disease is the leading cause of death in men. It is important to understand what causes CAD and how it is treated. What are the causes? CAD is most often caused by atherosclerosis. This is the buildup of fat and cholesterol (plaque) on the inside of the arteries. Over time, the plaque may narrow or block the artery, reducing blood flow to the heart. Plaque can also become weak and break off within a coronary artery and cause a sudden blockage. Other less common causes of CAD include: A blood clot or a piece of another substance that blocks the flow of blood in a coronary artery (embolism). A tearing of the artery (spontaneous coronary artery dissection). An enlargement of an artery (aneurysm). Inflammation (vasculitis) in the artery wall. What increases the risk? The following factors may make you more likely to develop this condition: Age. Men older than 59 years are at a greater risk of CAD. Family history of CAD. High blood pressure (hypertension). Diabetes. High cholesterol levels. Obesity. Other risk factors include: Tobacco use. Excessive alcohol use. Lack of exercise. A diet high in saturated and trans fats, such as fried food and processed meat. What are the signs or symptoms? Many people do not have any symptoms during the early stages of CAD. As the condition progresses, symptoms may include: Chest pain (angina). The pain can: Feel like crushing or squeezing, or like a tightness, pressure, fullness, or heaviness in the chest. Last more than a few  minutes or can stop and recur. The pain tends to get worse with exercise or stress and to fade with rest. Pain in the arms, neck, jaw, ear, or back. Unexplained heartburn or indigestion. Shortness of breath. Nausea or vomiting. Sudden light-headedness. Sudden cold sweats. Fluttering or fast heartbeat (palpitations). How is this diagnosed? This condition is diagnosed based on: Your family and medical history. A physical exam. Tests. These may include: A test to check the electrical signals in your heart (electrocardiogram). Exercise stress test. This looks for signs of blockage when the heart is stressed with exercise, such as running on a treadmill. Pharmacologic stress test. This test looks for signs of blockage when the heart is being stressed with a medicine. Blood tests to check levels of cardiac enzymes such as troponin and creatine kinase. Coronary angiogram. This is a procedure to look at the coronary arteries to see if there is any blockage. During this test, a dye is injected into your arteries so they appear on an X-ray. Coronary artery CT scan. This scan helps detect calcium deposits in your coronary arteries. Calcium deposits are an indicator of CAD. A test that uses sound waves to take a picture of your heart (echocardiogram). How is this treated? This condition may be treated by: Healthy lifestyle changes to reduce risk factors. Medicines such as: Antiplatelet medicines such as clopidogrel or aspirin. These help to prevent blood clots. Nitroglycerin. Blood pressure medicines. Cholesterol-lowering medicine. Coronary angioplasty and stenting. During this procedure, a thin, flexible tube is inserted through a blood vessel and into a blocked artery. A balloon or similar device on the  end of the tube is inflated to open up the artery. In some cases, a small, mesh tube (stent) is inserted into the artery to keep it open. Coronary artery bypass surgery. During this surgery, veins  or arteries from other parts of the body are used to create a bypass around the blockage and allow blood to reach your heart. Follow these instructions at home: Medicines Take over-the-counter and prescription medicines only as told by your health care provider. Do not take the following medicines unless your health care provider approves: NSAIDs, such as ibuprofen, naproxen, or celecoxib. Vitamin supplements that contain vitamin A, vitamin E, or both. Lifestyle Follow an exercise program approved by your health care provider. Ask your health care provider if cardiac rehab is appropriate. Maintain a healthy weight or lose weight as approved by your health care provider. Learn to manage stress or try to limit your stress. Ask your health care provider for suggestions if you need help. Get screened for depression and seek treatment, if needed. Do not use any products that contain nicotine or tobacco. These products include cigarettes, chewing tobacco, and vaping devices, such as e-cigarettes. If you need help quitting, ask your health care provider. Eating and drinking  Follow a heart-healthy diet. A dietitian can help educate you about healthy food options and changes. In general, eat plenty of fruits and vegetables, lean meats, and whole grains. Avoid foods high in: Sugar. Salt (sodium). Saturated fat, such as processed or fatty meat. Trans fat, such as fried foods. Use healthy cooking methods such as roasting, grilling, broiling, baking, poaching, steaming, or stir-frying. Do not drink alcohol if your health care provider tells you not to drink. If you drink alcohol: Limit how much you have to 0-2 drinks a day. Know how much alcohol is in your drink. In the [place] one drink equals one 12 oz bottle of beer (355 mL), one 5 oz glass of wine (148 mL), or one 1 oz glass of hard liquor (44 mL). General instructions Manage any other health conditions, such as high cholesterol, hypertension, and  diabetes. These conditions affect your heart. Your health care provider may ask you to monitor your blood pressure. Keep all follow-up visits. This is important. Get help right away if: You have pain in your chest, neck, ear, arm, jaw, stomach, or back that: Lasts more than a few minutes. Is recurring. Is not relieved by taking medicine under your tongue (sublingual nitroglycerin). You have profuse sweating without cause. You have unexplained: Heartburn or indigestion. Shortness of breath or difficulty breathing. Fluttering or fast heartbeat (palpitations). Nausea or vomiting. Fatigue or weakness. Feelings of nervousness or anxiety. You have sudden light-headedness or dizziness. You faint. These symptoms may be an emergency. Get help right away. Call 911. Do not wait to see if the symptoms will go away. Do not drive yourself to the hospital. Summary Coronary artery disease (CAD) is a condition in which the arteries that lead to the heart (coronary arteries) become narrow or blocked. Prolonged reduced blood flow can cause a heart attack. CAD can be treated with lifestyle changes, medicines, coronary angioplasty or stents, coronary artery bypass surgery, or a combination of these treatments. Keep all follow-up visits. This is important. This information is not intended to replace advice given to you by your health care provider. Make sure you discuss any questions you have with your health care provider. Document Revised: 03/10/2021 Document Reviewed: 03/10/2021 Elsevier Patient Education  2024 [employer].

## 2022-12-08 ENCOUNTER — Other Ambulatory Visit: Payer: Self-pay

## 2022-12-08 ENCOUNTER — Encounter (HOSPITAL_BASED_OUTPATIENT_CLINIC_OR_DEPARTMENT_OTHER): Payer: Self-pay | Admitting: Otolaryngology

## 2022-12-09 ENCOUNTER — Other Ambulatory Visit: Payer: Self-pay | Admitting: Otolaryngology

## 2022-12-11 ENCOUNTER — Encounter: Payer: Self-pay | Admitting: Internal Medicine

## 2022-12-11 DIAGNOSIS — N5201 Erectile dysfunction due to arterial insufficiency: Secondary | ICD-10-CM

## 2022-12-12 ENCOUNTER — Other Ambulatory Visit: Payer: Self-pay

## 2022-12-12 MED ORDER — SILDENAFIL CITRATE 20 MG PO TABS: 80.0000 mg | ORAL_TABLET | Freq: Every day | ORAL | 2 refills | Status: AC | PRN

## 2022-12-13 NOTE — Progress Notes (Signed)

## 2022-12-14 ENCOUNTER — Ambulatory Visit (HOSPITAL_BASED_OUTPATIENT_CLINIC_OR_DEPARTMENT_OTHER)
Admission: RE | Admit: 2022-12-14 | Discharge: 2022-12-14 | Disposition: A | Discharge status: Home / Self Care | Payer: BC Managed Care – PPO | Source: Ambulatory Visit | Attending: Otolaryngology | Admitting: Otolaryngology

## 2022-12-14 ENCOUNTER — Other Ambulatory Visit: Payer: Self-pay

## 2022-12-14 ENCOUNTER — Ambulatory Visit (HOSPITAL_BASED_OUTPATIENT_CLINIC_OR_DEPARTMENT_OTHER): Payer: BC Managed Care – PPO | Admitting: Anesthesiology

## 2022-12-14 ENCOUNTER — Encounter (HOSPITAL_BASED_OUTPATIENT_CLINIC_OR_DEPARTMENT_OTHER)
Admission: RE | Disposition: A | Discharge status: Home / Self Care | Payer: Self-pay | Source: Ambulatory Visit | Attending: Otolaryngology

## 2022-12-14 ENCOUNTER — Encounter (HOSPITAL_BASED_OUTPATIENT_CLINIC_OR_DEPARTMENT_OTHER): Payer: Self-pay | Admitting: Otolaryngology

## 2022-12-14 DIAGNOSIS — J3489 Other specified disorders of nose and nasal sinuses: Secondary | ICD-10-CM | POA: Insufficient documentation

## 2022-12-14 DIAGNOSIS — Z7902 Long term (current) use of antithrombotics/antiplatelets: Secondary | ICD-10-CM | POA: Insufficient documentation

## 2022-12-14 DIAGNOSIS — J320 Chronic maxillary sinusitis: Secondary | ICD-10-CM | POA: Diagnosis not present

## 2022-12-14 DIAGNOSIS — I10 Essential (primary) hypertension: Secondary | ICD-10-CM | POA: Diagnosis not present

## 2022-12-14 DIAGNOSIS — F17210 Nicotine dependence, cigarettes, uncomplicated: Secondary | ICD-10-CM | POA: Diagnosis not present

## 2022-12-14 DIAGNOSIS — J343 Hypertrophy of nasal turbinates: Secondary | ICD-10-CM | POA: Insufficient documentation

## 2022-12-14 DIAGNOSIS — Z8673 Personal history of transient ischemic attack (TIA), and cerebral infarction without residual deficits: Secondary | ICD-10-CM | POA: Insufficient documentation

## 2022-12-14 DIAGNOSIS — J342 Deviated nasal septum: Secondary | ICD-10-CM | POA: Insufficient documentation

## 2022-12-14 DIAGNOSIS — I252 Old myocardial infarction: Secondary | ICD-10-CM | POA: Insufficient documentation

## 2022-12-14 DIAGNOSIS — I2510 Atherosclerotic heart disease of native coronary artery without angina pectoris: Secondary | ICD-10-CM | POA: Diagnosis not present

## 2022-12-14 HISTORY — DX: Personal history of other specified conditions: Z87.898

## 2022-12-14 HISTORY — PX: NASAL SEPTOPLASTY W/ TURBINOPLASTY: SHX2070

## 2022-12-14 HISTORY — PX: NASAL SINUS SURGERY: SHX719

## 2022-12-14 HISTORY — DX: Post-traumatic stress disorder, unspecified: F43.10

## 2022-12-14 SURGERY — SEPTOPLASTY, NOSE, WITH NASAL TURBINATE REDUCTION
Anesthesia: General | Site: Nose | Laterality: Bilateral

## 2022-12-14 MED ORDER — ONDANSETRON HCL 4 MG/2ML IJ SOLN
INTRAMUSCULAR | Status: AC
  Filled 2022-12-14: qty 2

## 2022-12-14 MED ORDER — PROPOFOL 10 MG/ML IV BOLUS
INTRAVENOUS | Status: AC
  Filled 2022-12-14: qty 20

## 2022-12-14 MED ORDER — HYDROMORPHONE HCL 1 MG/ML IJ SOLN
0.2500 mg | INTRAMUSCULAR | Status: DC | PRN
  Administered 2022-12-14: 0.5 mg via INTRAVENOUS

## 2022-12-14 MED ORDER — PROPOFOL 500 MG/50ML IV EMUL
INTRAVENOUS | Status: AC
  Filled 2022-12-14: qty 50

## 2022-12-14 MED ORDER — OXYCODONE HCL 5 MG PO TABS
5.0000 mg | ORAL_TABLET | Freq: Once | ORAL | Status: AC | PRN
  Administered 2022-12-14: 5 mg via ORAL

## 2022-12-14 MED ORDER — MUPIROCIN 2 % EX OINT
TOPICAL_OINTMENT | CUTANEOUS | Status: AC
  Filled 2022-12-14: qty 22

## 2022-12-14 MED ORDER — ROCURONIUM BROMIDE 10 MG/ML (PF) SYRINGE
PREFILLED_SYRINGE | INTRAVENOUS | Status: DC | PRN
  Administered 2022-12-14: 60 mg via INTRAVENOUS

## 2022-12-14 MED ORDER — OXYCODONE HCL 5 MG PO TABS
ORAL_TABLET | ORAL | Status: AC
  Filled 2022-12-14: qty 1

## 2022-12-14 MED ORDER — HYDROMORPHONE HCL 1 MG/ML IJ SOLN
INTRAMUSCULAR | Status: AC
  Filled 2022-12-14: qty 0.5

## 2022-12-14 MED ORDER — FENTANYL CITRATE (PF) 250 MCG/5ML IJ SOLN
INTRAMUSCULAR | Status: DC | PRN
  Administered 2022-12-14: 100 ug via INTRAVENOUS

## 2022-12-14 MED ORDER — SUGAMMADEX SODIUM 200 MG/2ML IV SOLN
INTRAVENOUS | Status: DC | PRN
  Administered 2022-12-14: 200 mg via INTRAVENOUS

## 2022-12-14 MED ORDER — LACTATED RINGERS IV SOLN: INTRAVENOUS | Status: DC

## 2022-12-14 MED ORDER — OXYMETAZOLINE HCL 0.05 % NA SOLN
NASAL | Status: AC
  Filled 2022-12-14: qty 30

## 2022-12-14 MED ORDER — NALOXONE HCL 4 MG/0.1ML NA LIQD: NASAL | 0 refills | Status: DC

## 2022-12-14 MED ORDER — SODIUM CHLORIDE 0.9 % IR SOLN
Status: DC | PRN
  Administered 2022-12-14: 1

## 2022-12-14 MED ORDER — BACITRACIN ZINC 500 UNIT/GM EX OINT
TOPICAL_OINTMENT | CUTANEOUS | Status: DC | PRN
  Administered 2022-12-14: 1 via TOPICAL

## 2022-12-14 MED ORDER — LIDOCAINE 2% (20 MG/ML) 5 ML SYRINGE
INTRAMUSCULAR | Status: AC
  Filled 2022-12-14: qty 5

## 2022-12-14 MED ORDER — ONDANSETRON HCL 4 MG/2ML IJ SOLN
INTRAMUSCULAR | Status: DC | PRN
Start: 2022-12-14 — End: 2022-12-14
  Administered 2022-12-14: 4 mg via INTRAVENOUS

## 2022-12-14 MED ORDER — LIDOCAINE 2% (20 MG/ML) 5 ML SYRINGE
INTRAMUSCULAR | Status: DC | PRN
  Administered 2022-12-14: 60 mg via INTRAVENOUS

## 2022-12-14 MED ORDER — LIDOCAINE-EPINEPHRINE 1 %-1:100000 IJ SOLN
INTRAMUSCULAR | Status: DC | PRN
  Administered 2022-12-14: 10 mL

## 2022-12-14 MED ORDER — AMISULPRIDE (ANTIEMETIC) 5 MG/2ML IV SOLN: 10.0000 mg | Freq: Once | INTRAVENOUS | Status: DC | PRN

## 2022-12-14 MED ORDER — EPHEDRINE SULFATE-NACL 50-0.9 MG/10ML-% IV SOSY
PREFILLED_SYRINGE | INTRAVENOUS | Status: DC | PRN
  Administered 2022-12-14 (×2): 10 mg via INTRAVENOUS

## 2022-12-14 MED ORDER — DEXAMETHASONE SODIUM PHOSPHATE 10 MG/ML IJ SOLN
INTRAMUSCULAR | Status: AC
  Filled 2022-12-14: qty 1

## 2022-12-14 MED ORDER — FLUORESCEIN SODIUM 1 MG OP STRP
ORAL_STRIP | OPHTHALMIC | Status: DC | PRN
  Administered 2022-12-14: 1

## 2022-12-14 MED ORDER — PROMETHAZINE HCL 25 MG/ML IJ SOLN: 6.2500 mg | INTRAMUSCULAR | Status: DC | PRN

## 2022-12-14 MED ORDER — MEPERIDINE HCL 25 MG/ML IJ SOLN: 6.2500 mg | INTRAMUSCULAR | Status: DC | PRN

## 2022-12-14 MED ORDER — PHENYLEPHRINE 80 MCG/ML (10ML) SYRINGE FOR IV PUSH (FOR BLOOD PRESSURE SUPPORT)
PREFILLED_SYRINGE | INTRAVENOUS | Status: DC | PRN
  Administered 2022-12-14 (×2): 160 ug via INTRAVENOUS

## 2022-12-14 MED ORDER — OXYCODONE HCL 5 MG PO TABS: 5.0000 mg | ORAL_TABLET | Freq: Four times a day (QID) | ORAL | 0 refills | Status: AC | PRN

## 2022-12-14 MED ORDER — EPINEPHRINE HCL (NASAL) 0.1 % NA SOLN
NASAL | Status: AC
  Filled 2022-12-14: qty 10

## 2022-12-14 MED ORDER — MIDAZOLAM HCL 5 MG/5ML IJ SOLN
INTRAMUSCULAR | Status: DC | PRN
  Administered 2022-12-14: 2 mg via INTRAVENOUS

## 2022-12-14 MED ORDER — DEXAMETHASONE SODIUM PHOSPHATE 10 MG/ML IJ SOLN
INTRAMUSCULAR | Status: DC | PRN
  Administered 2022-12-14: 10 mg via INTRAVENOUS

## 2022-12-14 MED ORDER — BACITRACIN ZINC 500 UNIT/GM EX OINT
TOPICAL_OINTMENT | CUTANEOUS | Status: AC
  Filled 2022-12-14: qty 28.35

## 2022-12-14 MED ORDER — ACETAMINOPHEN 500 MG PO TABS: 500.0000 mg | ORAL_TABLET | Freq: Four times a day (QID) | ORAL | 1 refills | Status: AC | PRN

## 2022-12-14 MED ORDER — PROPOFOL 10 MG/ML IV BOLUS
INTRAVENOUS | Status: DC | PRN
  Administered 2022-12-14: 160 mg via INTRAVENOUS

## 2022-12-14 MED ORDER — ROCURONIUM BROMIDE 10 MG/ML (PF) SYRINGE
PREFILLED_SYRINGE | INTRAVENOUS | Status: AC
  Filled 2022-12-14: qty 10

## 2022-12-14 MED ORDER — LIDOCAINE-EPINEPHRINE 1 %-1:100000 IJ SOLN
INTRAMUSCULAR | Status: AC
  Filled 2022-12-14: qty 1

## 2022-12-14 MED ORDER — FENTANYL CITRATE (PF) 100 MCG/2ML IJ SOLN
INTRAMUSCULAR | Status: AC
  Filled 2022-12-14: qty 2

## 2022-12-14 MED ORDER — OXYCODONE HCL 5 MG/5ML PO SOLN: 5.0000 mg | Freq: Once | ORAL | Status: AC | PRN

## 2022-12-14 MED ORDER — MIDAZOLAM HCL 2 MG/2ML IJ SOLN
INTRAMUSCULAR | Status: AC
  Filled 2022-12-14: qty 2

## 2022-12-14 MED ORDER — DEXMEDETOMIDINE HCL IN NACL 80 MCG/20ML IV SOLN
INTRAVENOUS | Status: DC | PRN
  Administered 2022-12-14 (×2): 8 ug via INTRAVENOUS
  Administered 2022-12-14: 4 ug via INTRAVENOUS
  Administered 2022-12-14 (×2): 8 ug via INTRAVENOUS
  Administered 2022-12-14 (×2): 4 ug via INTRAVENOUS

## 2022-12-14 MED ORDER — EPINEPHRINE HCL (NASAL) 0.1 % NA SOLN
NASAL | Status: DC | PRN
  Administered 2022-12-14: 10 mL via TOPICAL

## 2022-12-14 MED ORDER — FLUORESCEIN SODIUM 1 MG OP STRP
ORAL_STRIP | OPHTHALMIC | Status: AC
  Filled 2022-12-14: qty 1

## 2022-12-14 SURGICAL SUPPLY — 83 items
APL SRG 3 HI ABS STRL LF PLS (MISCELLANEOUS) ×1
APPLICATOR DR MATTHEWS STRL (MISCELLANEOUS) ×1 IMPLANT
BLADE INF TURB ROT M4 2 5PK (BLADE) IMPLANT
BLADE RAD40 ROTATE 4M 4 5PK (BLADE) IMPLANT
BLADE RAD60 ROTATE M4 4 5PK (BLADE) IMPLANT
BLADE SHAVER TURBINATE 11X2.9 (BLADE) IMPLANT
BLADE SURG 15 STRL LF DISP TIS (BLADE) IMPLANT
BLADE SURG 15 STRL SS (BLADE)
BLADE TRICUT ROTATE M4 4 5PK (BLADE) ×1 IMPLANT
CANISTER SUC SOCK COL 7IN (MISCELLANEOUS) ×1 IMPLANT
CANISTER SUCT 1200ML W/VALVE (MISCELLANEOUS) ×2 IMPLANT
COAGULATOR SUCT 8FR VV (MISCELLANEOUS) IMPLANT
COAGULATOR SUCT SWTCH 10FR 6 (ELECTROSURGICAL) IMPLANT
DRESSING NASAL KENNEDY 3.5X.9 (MISCELLANEOUS) IMPLANT
DRSG NASAL KENNEDY 3.5X.9 (MISCELLANEOUS)
DRSG NASOPORE 8CM (GAUZE/BANDAGES/DRESSINGS) IMPLANT
DRSG TEGADERM 2-3/8X2-3/4 SM (GAUZE/BANDAGES/DRESSINGS) ×1 IMPLANT
DRSG TELFA 3X8 NADH STRL (GAUZE/BANDAGES/DRESSINGS) IMPLANT
ELECT COATED BLADE 2.86 ST (ELECTRODE) IMPLANT
ELECT NDL BLADE 2-5/6 (NEEDLE) IMPLANT
ELECT NEEDLE BLADE 2-5/6 (NEEDLE)
ELECT REM PT RETURN 9FT ADLT (ELECTROSURGICAL) ×1
ELECTRODE REM PT RTRN 9FT ADLT (ELECTROSURGICAL) ×1 IMPLANT
GAUZE SPONGE 2X2 STRL 8-PLY (GAUZE/BANDAGES/DRESSINGS) ×1 IMPLANT
GLOVE BIO SURGEON STRL SZ7 (GLOVE) IMPLANT
GLOVE BIO SURGEON STRL SZ7.5 (GLOVE) ×1 IMPLANT
GLOVE BIOGEL PI IND STRL 7.0 (GLOVE) IMPLANT
GLOVE BIOGEL PI IND STRL 8 (GLOVE) ×1 IMPLANT
GLOVE ECLIPSE 6.5 STRL STRAW (GLOVE) IMPLANT
GOWN STRL REUS W/ TWL LRG LVL3 (GOWN DISPOSABLE) ×1 IMPLANT
GOWN STRL REUS W/ TWL XL LVL3 (GOWN DISPOSABLE) ×1 IMPLANT
GOWN STRL REUS W/TWL LRG LVL3 (GOWN DISPOSABLE) ×2
GOWN STRL REUS W/TWL XL LVL3 (GOWN DISPOSABLE) ×1
HEMOSTAT ARISTA ABSORB 3G PWDR (HEMOSTASIS) IMPLANT
IV NS 500ML (IV SOLUTION) ×2
IV NS 500ML BAXH (IV SOLUTION) ×1 IMPLANT
IV SET EXT 30 76VOL 4 MALE LL (IV SETS) IMPLANT
MANIFOLD NEPTUNE II (INSTRUMENTS) IMPLANT
NDL HYPO 25GX1X1/2 BEV (NEEDLE) IMPLANT
NDL HYPO 25X1 1.5 SAFETY (NEEDLE) ×1 IMPLANT
NDL HYPO 27GX1-1/4 (NEEDLE) ×1 IMPLANT
NDL SAFETY ECLIP 18X1.5 (MISCELLANEOUS) IMPLANT
NDL SPNL 25GX3.5 QUINCKE BL (NEEDLE) ×1 IMPLANT
NEEDLE HYPO 25GX1X1/2 BEV (NEEDLE)
NEEDLE HYPO 25X1 1.5 SAFETY (NEEDLE)
NEEDLE HYPO 27GX1-1/4 (NEEDLE) ×1
NEEDLE SPNL 25GX3.5 QUINCKE BL (NEEDLE) ×1
NS IRRIG 1000ML POUR BTL (IV SOLUTION) ×1 IMPLANT
PACK BASIN DAY SURGERY FS (CUSTOM PROCEDURE TRAY) ×1 IMPLANT
PACK ENT DAY SURGERY (CUSTOM PROCEDURE TRAY) ×1 IMPLANT
PATTIES SURGICAL .5 X3 (DISPOSABLE) ×1 IMPLANT
PENCIL SMOKE EVACUATOR (MISCELLANEOUS) IMPLANT
SHEATH ENDOSCRUB 0 DEG (SHEATH) ×1 IMPLANT
SHEATH ENDOSCRUB 30 DEG (SHEATH) IMPLANT
SHEET MEDIUM DRAPE 40X70 STRL (DRAPES) IMPLANT
SLEEVE SCD COMPRESS KNEE MED (STOCKING) ×1 IMPLANT
SOL ANTI FOG 6CC (MISCELLANEOUS) ×1 IMPLANT
SPIKE FLUID TRANSFER (MISCELLANEOUS) IMPLANT
SPLINT NASAL AIRWAY SILICONE (MISCELLANEOUS) ×1 IMPLANT
SPLINT NASAL POSISEP X .6X2 (GAUZE/BANDAGES/DRESSINGS) IMPLANT
SPLINT NASAL POSISEP X2 .8X2.3 (GAUZE/BANDAGES/DRESSINGS) IMPLANT
SPONGE NEURO XRAY DETECT 1X3 (DISPOSABLE) IMPLANT
SPONGE SURGIFOAM ABS GEL 12-7 (HEMOSTASIS) IMPLANT
SUT CHROMIC 4 0 P 3 18 (SUTURE) IMPLANT
SUT CHROMIC 4 0 PS 2 18 (SUTURE) ×1 IMPLANT
SUT CHROMIC 5 0 P 3 (SUTURE) IMPLANT
SUT ETHILON 3 0 FSL (SUTURE) IMPLANT
SUT PDS II 5-0 VIOLET 1X18 TF (SUTURE) IMPLANT
SUT PLAIN 4 0 ~~LOC~~ 1 (SUTURE) IMPLANT
SUT PLAIN 5 0 P 3 18 (SUTURE) IMPLANT
SUT PLAIN GUT FAST 5-0 (SUTURE) IMPLANT
SUT SILK 2 0 SH (SUTURE) IMPLANT
SUT SILK 3 0 PS 1 (SUTURE) ×1 IMPLANT
SWAB COLLECTION DEVICE MRSA (MISCELLANEOUS) IMPLANT
SWAB CULTURE ESWAB REG 1ML (MISCELLANEOUS) IMPLANT
SYR 10ML LL (SYRINGE) IMPLANT
SYR 3ML 23GX1 SAFETY (SYRINGE) ×1 IMPLANT
SYR CONTROL 10ML LL (SYRINGE) IMPLANT
TOWEL GREEN STERILE FF (TOWEL DISPOSABLE) ×1 IMPLANT
TUBE CONNECTING 20X1/4 (TUBING) ×1 IMPLANT
TUBE SALEM SUMP 12FR 48 (TUBING) IMPLANT
TUBE SALEM SUMP 16F (TUBING) IMPLANT
YANKAUER SUCT BULB TIP NO VENT (SUCTIONS) ×1 IMPLANT

## 2022-12-14 NOTE — Transfer of Care (Signed)
Immediate Anesthesia Transfer of Care Note  Patient: Louis Cardenas  Procedure(s) Performed: NASAL SEPTOPLASTY WITH TURBINATE REDUCTION (Bilateral: Nose) FUNCTIONAL ENDOSCOPIC SINUS SURGERY (MAXILLARY ANTROSTOMIES; ANTERIOR ETHMOIDECTOMIES) (Bilateral: Nose)  Patient Location: PACU  Anesthesia Type:General  Level of Consciousness: awake, alert , and oriented  Airway & Oxygen Therapy: Patient Spontanous Breathing  Post-op Assessment: Report given to RN and Post -op Vital signs reviewed and unstable, Anesthesiologist notified  Post vital signs: Reviewed and stable  Last Vitals:  Vitals Value Taken Time  BP 122/66 12/14/22 1145  Temp 36.6 C 12/14/22 1140  Pulse 73 12/14/22 1154  Resp 18 12/14/22 1154  SpO2 96 % 12/14/22 1154  Vitals shown include unfiled device data.  Last Pain:  Vitals:   12/14/22 1140  TempSrc:   PainSc: 0-No pain      Patients Stated Pain Goal: 3 (12/14/22 8413)  Complications: Pt woke up with emergence delirium and has a history of PTSD. 8 mcg in PACU. Pt calm.

## 2022-12-14 NOTE — Anesthesia Procedure Notes (Signed)
Procedure Name: Intubation Date/Time: 12/14/2022 10:06 AM  Performed by: Demetrio Lapping, CRNAPre-anesthesia Checklist: Patient identified, Emergency Drugs available, Suction available and Patient being monitored Patient Re-evaluated:Patient Re-evaluated prior to induction Oxygen Delivery Method: Circle System Utilized Preoxygenation: Pre-oxygenation with 100% oxygen Induction Type: IV induction Ventilation: Mask ventilation without difficulty Laryngoscope Size: Mac and 4 Grade View: Grade I Tube type: Oral Number of attempts: 1 Airway Equipment and Method: Stylet Placement Confirmation: ETT inserted through vocal cords under direct vision, positive ETCO2 and breath sounds checked- equal and bilateral Secured at: 23 cm Tube secured with: Tape Dental Injury: Teeth and Oropharynx as per pre-operative assessment

## 2022-12-14 NOTE — H&P (Signed)
Louis Cardenas is an 57 y.o. male.    Chief Complaint:  Nasal obstruction, chronic sinusitis   HPI: Patient presents today for planned elective procedure.  He/she denies any interval change in history since office visit on 10/11/22.   Past Medical History:  Diagnosis Date   Anxiety    Bipolar 1 disorder (HCC)    CAD (coronary artery disease)    Depressed    Eating disorder    bulimia   Family history of colon cancer in father    GERD (gastroesophageal reflux disease)    History of colon polyps    Hypertension    Hypogonadism in male    MI (myocardial infarction) (HCC)    Stroke (HCC)    Thyroid nodule     Past Surgical History:  Procedure Laterality Date   APPENDECTOMY  1998   COLONOSCOPY     CORONARY ANGIOPLASTY WITH STENT PLACEMENT     ROTATOR CUFF REPAIR Left    with screws   UPPER GASTROINTESTINAL ENDOSCOPY      Family History  Problem Relation Age of Onset   Thyroid disease Mother    Depression Mother    Hypertension Mother    Colon cancer Father    Liver cancer Father    Esophageal cancer Neg Hx    Rectal cancer Neg Hx    Stomach cancer Neg Hx     Social History:  reports that he has been smoking cigarettes. He has a 25 pack-year smoking history. He has never used smokeless tobacco. He reports current alcohol use of about 2.0 standard drinks of alcohol per week. He reports current drug use. Drug: Marijuana.  Allergies: No Known Allergies  No medications prior to admission.    No results found for this or any previous visit (from the past 48 hour(s)). No results found.  ROS: negative other than stated in HPI  Height 5' 5.5" (1.664 m), weight 61.2 kg.  PHYSICAL EXAM: General: Resting comfortably in NAD  Lungs: Non-labored respiratinos  Studies Reviewed: CT maxillofacial   Assessment/Plan Chronic maxillary sinusitis Deviated nasal septum Inferior turbinate hypertrophy Hx of CVA on Plavix/aspirin (held for surgery) CAD  Proceed with  Septoplasty, inferior turbinate reductions, FESS (bilateral maxillary antrostomy and anterior ethmoidectomy). Informed consent obtained RBA discussed.     Electronically signed by:  Scarlette Ar, MD  Staff Physician Facial Plastic & Reconstructive Surgery Otolaryngology - Head and Neck Surgery [facility]  12/14/2022, 7:41 AM

## 2022-12-14 NOTE — Discharge Instructions (Addendum)
Endoscopic Sinus Surgery: Postoperative Care Instructions  Your active participation in the postoperative phase of treatment is critical to the success of your surgery. Please read and follow the instructions below.  Bleeding: It is normal to experience some nasal bleeding the afternoon/evening of surgery. If bleeding occurs, lean forward slightly and gently pinch the bottom 2/3 of your nose between your thumb and forefinger. Hold this for approximately 10 minutes. If you are still experiencing bleeding, you may apply Afrin (other options include neo-synephrine, 4 way nasal spray, etc) to your nose to shrink the blood vessels. Spray 3 puffs to the affected nostril(s) and then gently pinch the nose for 10 minutes. If this does not relieve the bleeding or if the bleeding is more significant, please call the hospital operator and ask for the ENT doctor on call. If extensive bleeding occurs, please call 911 or be seen at your closest emergency department. Medications:  Pain medication: The only mediation you will need to take the night of surgery is pain medication. If you wish a non-narcotic alternative, extra-strength Tylenol and ibuprofen are often sufficient.  Antibiotics/oral steroids: If you are prescribed either antibiotic or oral steroids, start these the day after surgery.   Nasal steroid spray: Start (or restart) your nasal steroid spray one week after surgery.   Aspirin: DO NOT take aspirin for at least three days after the surgery. Saline:  Moisture is critical to proper healing. You will need to moisturize and rinse your nose for at least 6 weeks after surgery. Nasal Mist: The day after surgery, start spraying your nose with saline mist spray. Apply 2-3 sprays to each nostril every one to two hours throughout the day. Common brands include: Ayr, Ocean, or Simply Saline. They are available over-the-counter at most pharmacies.  Nasal Irrigations: The day after surgery, begin sinus rinses twice a  day using the "NeilMed Sinus Rinse Kit"  (available for purchase at many pharmacies). Use gentle pressure when irrigating. If you have any discomfort with irrigation, reduce the amount of pressure you are applying or discontinue irrigations until the first post-operative visit.  Post-operative care: Do not blow your nose for the first week after surgery. You may sniff back. If you need to sneeze, do not suppress it but instead sneeze with your mouth open. After 1 week, you may blow your nose gently. No strenuous activity for one week after surgery. No straining or lifting more than 15 lbs.  You should not bend over at the waist to pick things up. Instead bend at the knees, with your head up. Light walking and normal household activities are acceptable immediately after surgery. You may resume exercise at 50% intensity after one week, and full intensity at two weeks. You may drive the day after surgery if you are not requiring narcotic pain medication. If you are taking antibiotics and experience stomach upset, active culture yogurt (Nancy's yogurt) or lactobacillus tablets (available at health food stores) on a daily basis may help.  If you develop diarrhea, stop your antibiotics and contact our office.  Persistent diarrhea may require further medical evaluation. You may eat a regular diet. You should plan on taking one week off from work and ideally have a half-day planned for your first day back. Do not fly without your doctor's clearance for 7 days after surgery. Post-operative visits: You will have frequent return visits to our office after surgery.  At each visit, a procedure called nasal endoscopy will be performed. The sinus cavities may be cleaned (  termed "debridement") in order to assure appropriate healing of the sinuses. Visits typically occur 1, 3, and 6 weeks after surgery. We recommend taking a dose of pain medication about 45 minutes to one hour before your first postoperative visit (as  long as someone can accompany you to your visit).  Post-operative visits are usually scheduled at the time of surgery scheduling, however if you have any questions about your post-operative visit you can contact our clinic at [phone]. Call our office if you experience any of the following: Fever higher than 101?F Clear, watery nasal drainage Any visual changes or marked swelling of the eyes Severe headache or neck stiffness Severe diarrhea Brisk bleeding  If you need to speak to a doctor after hours, please call the [facility] [place] at [phone] and ask for the ENT doctor on call, or call 911 for emergencies.  Post Anesthesia Home Care Instructions  Activity: Get plenty of rest for the remainder of the day. A responsible individual must stay with you for 24 hours following the procedure.  For the next 24 hours, DO NOT: -Drive a car -Advertising copywriter -Drink alcoholic beverages -Take any medication unless instructed by your physician -Make any legal decisions or sign important papers.  Meals: Start with liquid foods such as gelatin or soup. Progress to regular foods as tolerated. Avoid greasy, spicy, heavy foods. If nausea and/or vomiting occur, drink only clear liquids until the nausea and/or vomiting subsides. Call your physician if vomiting continues.  Special Instructions/Symptoms: Your throat may feel dry or sore from the anesthesia or the breathing tube placed in your throat during surgery. If this causes discomfort, gargle with warm salt water. The discomfort should disappear within 24 hours.  If you had a scopolamine patch placed behind your ear for the management of post- operative nausea and/or vomiting:  1. The medication in the patch is effective for 72 hours, after which it should be removed.  Wrap patch in a tissue and discard in the trash. Wash hands thoroughly with soap and water. 2. You may remove the patch earlier than  72 hours if you experience unpleasant side effects which may include dry mouth, dizziness or visual disturbances. 3. Avoid touching the patch. Wash your hands with soap and water after contact with the patch.    Post Anesthesia Home Care Instructions  Activity: Get plenty of rest for the remainder of the day. A responsible individual must stay with you for 24 hours following the procedure.  For the next 24 hours, DO NOT: -Drive a car -Advertising copywriter -Drink alcoholic beverages -Take any medication unless instructed by your physician -Make any legal decisions or sign important papers.  Meals: Start with liquid foods such as gelatin or soup. Progress to regular foods as tolerated. Avoid greasy, spicy, heavy foods. If nausea and/or vomiting occur, drink only clear liquids until the nausea and/or vomiting subsides. Call your physician if vomiting continues.  Special Instructions/Symptoms: Your throat may feel dry or sore from the anesthesia or the breathing tube placed in your throat during surgery. If this causes discomfort, gargle with warm salt water. The discomfort should disappear within 24 hours.  If you had a scopolamine patch placed behind your ear for the management of post- operative nausea and/or vomiting:  1. The medication in the patch is effective for 72 hours, after which it should be removed.  Wrap patch in a tissue and discard in the trash. Wash hands thoroughly with soap and water.  2. You may remove the patch earlier than 72 hours if you experience unpleasant side effects which may include dry mouth, dizziness or visual disturbances. 3. Avoid touching the patch. Wash your hands with soap and water after contact with the patch.

## 2022-12-14 NOTE — Op Note (Signed)
OPERATIVE NOTE  Louis Cardenas Date/Time of Admission: 12/14/2022  7:55 AM  CSN: [id number] Attending Provider: Scarlette Ar, MD Room/Bed: [facility]SP/NONE DOB: 12-09-1965 Age: 57 y.o.   Pre-Op Diagnosis: Deviated nasal septum; Hypertrophy of both inferior nasal turbinates  Post-Op Diagnosis: Deviated nasal septum; Hypertrophy of both inferior nasal turbinates  Procedure:  Endoscopic septoplasty 30520 Bilateral inferior turbinate reduction and out-fracture 30140 modifier 50 Bilateral maxillary antrostomy with tissue removal 95621 modifier 50 Bilateral anterior ethmoidectomy 31254 modifier 50  Anesthesia: General  Surgeon(s): Mervin Kung, MD  Staff: Circulator: Lenn Cal, RN Relief Circulator: Bowyer, Amy M, RN Scrub Person: Randalyn Rhea, RN; Griffin Basil, RN  Implants: * No implants in log *  Specimens: ID Type Source Tests Collected by Time Destination  1 : Left sinus contents Tissue PATH Sinus Contents/Nasal Polyps SURGICAL PATHOLOGY Scarlette Ar, MD 12/14/2022 1100   2 : Right sinus contents Tissue PATH Sinus Contents/Nasal Polyps SURGICAL PATHOLOGY Scarlette Ar, MD 12/14/2022 1101     Complications: none  EBL: 30 ML  IVF: Per anesthesia ML  Condition: stable  Operative Findings:  Right deviated septum 3+ ITH Bilateral maxillary sinus mucosal thickening with purulent drainage on the right. Anterior ethmoid mucosal thickening  Indications for Procedure: Louis Cardenas is a 57 y.o. 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 After discussing risks, benefits, and alternatives to the procedure, the patient elected to proceed.   Informed consent: Informed consent was obtained. Risks discussed in detail including pain, bleeding, infection, injury to the eye with associated vision changes, vision  loss, injury to the brain with associated CSF leak or meningitis, failure to improve or worsening of sense of smell, nasal synechiae, nasal septal perforation, need for further procedures, risks of general anesthesia. Despite these risks the patient requested to proceed with surgery.  Details of the Procedure:  The patient was identified in the pre-op area and brought back to the operating room and laid in the supine position. General anesthesia was induced and the patient was endotracheally intubated without complication. A surgical pause was then performed to identify the correct patient, procedure, and location. After all were in agreement, the head of the bed was rotated 180 degrees away from anesthesia.  The patient was prepped and draped in the standard clean fashion for endoscopic sinus surgery. The nose was decongested with 1:1000 topical epinephrine. The septum was infiltrated with 1% lidocaine 1:100k epi.   We first began with the septoplasty portion of the procedure. The patient had a right septal deviation narrowing the nasal cavity. A left sided hemitransfixtion incision was made and dissection taken down to the septal cartilage. A left sided mucoperichondrial flap was elevated beyond the septal deflection. The cartilage was then crossed over and a right sided mucoperichondrial flap was then elevated beyond the bony and cartilaginous deviation. The deviated portions of cartilage and bone were removed with care taken to leave a 1.5cm dorsal and caudal strut. The hemitransfixtion incision was then closed with 5- chromic gut and the septum quilted with 4-0 chromic.   All cottonoids were removed and set aside. The 0, 30, degree endoscopes were attached to the video monitor for endoscopic viewing as needed throughout the procedure, and our attention was directed to the right nasal cavity. Approximately 3cc of 1% with 1:100k were injected into the attachment of the middle turbinate as well as the  sphenopalatine artery location.  The middle turbinate was then deflected medially by a freer as a cottonoid soaked with 1:1000 epinephrine was inserted into the middle meatus. After this had time to take effect, the backbiter was then used to take down the uncinate, and the ball tipped probe was used to enter the maxillary sinus. The maxillary sinus ostium was widened anteriorly using the back-biter, and the edges were trimmed using thru-cut forceps and microdebrider. The 30 degree endoscope was used to confirm that the natural os was included in the antrostomy. The maxillary sinus was then widened to its anatomic limits (posterior maxillary wall, floor of the orbit, nasolacrimal duct, and concha of the inferior turbinate. The J curette was then used to enter the ethmoid bulla at an anterior inferior position and the anterior ethmoid tissue taken down with a combination of a currette and the microdebrider. All sinus disease was cleared out and the ant ethmoids and maxillary sinus irrigated with a copious amount of NS.   We then turned our attention to the left side. The middle turbinate was then deflected medially by a freer as a cottonoid soaked with 1:1000 epinephrine was inserted into the middle meatus. After this had time to take effect, the backbiter was then used to take down the uncinate, and the ball tipped probe was used to enter the maxillary sinus. The maxillary sinus ostium was widened anteriorly using the back-biter, and the edges were trimmed using thru-cut forceps and microdebrider. The 30 degree endoscope was used to confirm that the natural os was included in the antrostomy. The maxillary sinus was then widened to its anatomic limits (posterior maxillary wall, floor of the orbit, nasolacrimal duct, and concha of the inferior turbinate. The J curette was then used to enter the ethmoid bulla at an anterior inferior position and the anterior ethmoid tissue taken down with a combination of a currette  and the microdebrider. All sinus disease was cleared out and the ant ethmoids and maxillary sinus irrigated with a copious amount of NS.   All cottonoids were removed and hemostasis confirmed. The nasopharynx and oropharynx were suctioned free of blood and saline. Chitosan dressings were placed into the middle meatus bilaterally. A doyle splint was secured with 3-0 silk transseptal suture. A mustache dressing was then applied. Epinephrine pledgets were placed along the inferior turbinates and removed in PACU. The patient was then returned to the anesthetist, who awakened and extubated the patient without incident. The patient tolerated the procedure well without complications and was transferred to the recovery room in satisfactory condition.   Mervin Kung, MD [facility]  12/14/2022

## 2022-12-14 NOTE — Anesthesia Preprocedure Evaluation (Signed)
Anesthesia Evaluation  Patient identified by MRN, date of birth, ID band Patient awake    Reviewed: Allergy & Precautions, H&P , NPO status , Patient's Chart, lab work & pertinent test results  Airway Mallampati: II  TM Distance: >3 FB Neck ROM: Full    Dental no notable dental hx.    Pulmonary neg pulmonary ROS, Current Smoker and Patient abstained from smoking.   Pulmonary exam normal breath sounds clear to auscultation       Cardiovascular hypertension, Pt. on medications + CAD and + Past MI  Normal cardiovascular exam Rhythm:Regular Rate:Normal     Neuro/Psych  Headaches  Anxiety Depression Bipolar Disorder   CVA  negative psych ROS   GI/Hepatic Neg liver ROS,GERD  ,,  Endo/Other  negative endocrine ROS    Renal/GU Renal InsufficiencyRenal disease  negative genitourinary   Musculoskeletal negative musculoskeletal ROS (+)    Abdominal   Peds negative pediatric ROS (+)  Hematology negative hematology ROS (+)   Anesthesia Other Findings   Reproductive/Obstetrics negative OB ROS                             Anesthesia Physical Anesthesia Plan  ASA: 3  Anesthesia Plan: General   Post-op Pain Management: Dilaudid IV   Induction: Intravenous  PONV Risk Score and Plan: 1 and Ondansetron and Treatment may vary due to age or medical condition  Airway Management Planned: Oral ETT  Additional Equipment:   Intra-op Plan:   Post-operative Plan: Extubation in OR  Informed Consent: I have reviewed the patients History and Physical, chart, labs and discussed the procedure including the risks, benefits and alternatives for the proposed anesthesia with the patient or authorized representative who has indicated his/her understanding and acceptance.     Dental advisory given  Plan Discussed with: CRNA  Anesthesia Plan Comments:        Anesthesia Quick Evaluation

## 2022-12-14 NOTE — Anesthesia Postprocedure Evaluation (Signed)
Anesthesia Post Note  Patient: Louis Cardenas  Procedure(s) Performed: NASAL SEPTOPLASTY WITH TURBINATE REDUCTION (Bilateral: Nose) FUNCTIONAL ENDOSCOPIC SINUS SURGERY (MAXILLARY ANTROSTOMIES; ANTERIOR ETHMOIDECTOMIES) (Bilateral: Nose)     Patient location during evaluation: PACU Anesthesia Type: General Level of consciousness: awake and alert Pain management: pain level controlled Vital Signs Assessment: post-procedure vital signs reviewed and stable Respiratory status: spontaneous breathing, nonlabored ventilation and respiratory function stable Cardiovascular status: blood pressure returned to baseline and stable Postop Assessment: no apparent nausea or vomiting Anesthetic complications: no   No notable events documented.  Last Vitals:  Vitals:   12/14/22 1207 12/14/22 1231  BP:  127/70  Pulse: 73 62  Resp: 17 16  Temp:  37.5 C  SpO2: 95% 94%    Last Pain:  Vitals:   12/14/22 1231  TempSrc: Oral  PainSc: 7                  Lowella Curb

## 2022-12-15 ENCOUNTER — Encounter (HOSPITAL_BASED_OUTPATIENT_CLINIC_OR_DEPARTMENT_OTHER): Payer: Self-pay | Admitting: Otolaryngology

## 2022-12-15 LAB — LAMOTRIGINE LEVEL: Lamotrigine Lvl: 11.5 ug/mL (ref 2.5–15.0)

## 2022-12-15 LAB — SURGICAL PATHOLOGY

## 2022-12-23 DIAGNOSIS — Z9889 Other specified postprocedural states: Secondary | ICD-10-CM | POA: Insufficient documentation

## 2022-12-24 ENCOUNTER — Telehealth: Payer: Self-pay | Admitting: Internal Medicine

## 2022-12-24 NOTE — Telephone Encounter (Signed)
CAD s/p remote PCI in 2011 and doing well, s/p recent ENT surgery, having issues with nose bleeding since surgery 8/21. He has been holding aspirin and plavix. Advised for remote PCI in 2011 okay to hold DAPT for 2 additional days, but recommend discussing with neurology due to hx of CVA.

## 2023-01-13 ENCOUNTER — Other Ambulatory Visit: Payer: Self-pay | Admitting: Internal Medicine

## 2023-01-13 DIAGNOSIS — J3089 Other allergic rhinitis: Secondary | ICD-10-CM

## 2023-01-22 ENCOUNTER — Other Ambulatory Visit: Payer: Self-pay | Admitting: Cardiology

## 2023-01-27 ENCOUNTER — Other Ambulatory Visit: Payer: Self-pay | Admitting: Internal Medicine

## 2023-01-27 DIAGNOSIS — K2101 Gastro-esophageal reflux disease with esophagitis, with bleeding: Secondary | ICD-10-CM

## 2023-02-01 ENCOUNTER — Other Ambulatory Visit: Payer: Self-pay | Admitting: Cardiology

## 2023-02-01 ENCOUNTER — Other Ambulatory Visit: Payer: Self-pay | Admitting: Internal Medicine

## 2023-02-01 DIAGNOSIS — J3089 Other allergic rhinitis: Secondary | ICD-10-CM

## 2023-02-16 ENCOUNTER — Other Ambulatory Visit: Payer: Self-pay | Admitting: Cardiology

## 2023-02-19 NOTE — Progress Notes (Unsigned)
  Cardiology Office Note:   Date:  02/22/2023  ID:  Louis Cardenas, DOB 1965/05/31, MRN [id number] PCP: Etta Grandchild, MD  [facility] Providers Cardiologist:  None {  History of Present Illness:   Louis Cardenas is a 57 y.o. male who was referred by Etta Grandchild, MD for evaluation of CAD.  He had a myocardial infarction 2011 in [place].   He has had a very tough year.  His mom died.  His sister died.  He lost his job.  He has not been exercising routinely.  He is smoking cigarettes.  He says he is getting some increased shortness of breath with activity such as climbing a flight of stairs.  This is not new.  He has a sensation sometimes at night feeling like he is going to have a heart attack.  This is a very vague description.  He is not having any overt chest pressure, neck or arm discomfort.  He is not having any new palpitations, presyncope or syncope.  He has had a little weight gain.  He had no edema.    ROS: As stated in the HPI and negative for all other systems.  Studies Reviewed:    EKG:   EKG Interpretation Date/Time:  Wednesday February 22 2023 10:36:13 EDT Ventricular Rate:  57 PR Interval:  184 QRS Duration:  100 QT Interval:  408 QTC Calculation: 397 R Axis:   78  Text Interpretation: Sinus bradycardia When compared with ECG of 20-Jun-2022 20:55, No old tracing to compare Confirmed by Rollene Rotunda ([id number]) on 02/22/2023 10:57:19 AM     Risk Assessment/Calculations:              Physical Exam:   VS:  BP 124/84   Pulse (!) 57   Ht 5' 5.5" (1.664 m)   Wt 148 lb 3.2 oz (67.2 kg)   SpO2 97%   BMI 24.29 kg/m    Wt Readings from Last 3 Encounters:  02/22/23 148 lb 3.2 oz (67.2 kg)  12/14/22 138 lb 10.7 oz (62.9 kg)  12/01/22 140 lb (63.5 kg)     GEN: Well nourished, well developed in no acute distress NECK: No JVD; No carotid bruits CARDIAC: RRR, no murmurs, rubs, gallops RESPIRATORY:  Clear to auscultation without rales,  wheezing or rhonchi  ABDOMEN: Soft, non-tender, non-distended EXTREMITIES:  No edema; No deformity   ASSESSMENT AND PLAN:   CAD: He has some vague discomfort and shortness of breath.  Given this I think he should be screened with a stress test.  He said he had a really hard time when I sent him for his last treadmill test and he does not know that he be able to do it.  Therefore, he will have a [employer].   DYSLIPIDEMIA: LDL was up to 63.  I would like it to be in the 50s so I am going to increase his Lipitor to 80 mg daily and check a lipid profile in about 3 months.  He should also get an LP(a) with that.   TOBACCO ABUSE:   We again had a long conversation about this.  We talked about over-the-counter nicotine replacement.       Follow up with me in one year or sooner if he has progressive symptoms.  Signed, Rollene Rotunda, MD

## 2023-02-22 ENCOUNTER — Encounter: Payer: Self-pay | Admitting: Cardiology

## 2023-02-22 ENCOUNTER — Ambulatory Visit: Payer: BC Managed Care – PPO | Attending: Cardiology | Admitting: Cardiology

## 2023-02-22 VITALS — BP 124/84 | HR 57 | Ht 65.5 in | Wt 148.2 lb

## 2023-02-22 DIAGNOSIS — I2510 Atherosclerotic heart disease of native coronary artery without angina pectoris: Secondary | ICD-10-CM

## 2023-02-22 DIAGNOSIS — Z720 Tobacco use: Secondary | ICD-10-CM

## 2023-02-22 DIAGNOSIS — E785 Hyperlipidemia, unspecified: Secondary | ICD-10-CM

## 2023-02-22 MED ORDER — ATORVASTATIN CALCIUM 80 MG PO TABS: 80.0000 mg | ORAL_TABLET | Freq: Every day | ORAL | 3 refills | Status: DC

## 2023-02-22 NOTE — Patient Instructions (Addendum)
Medication Instructions:  Increase Atorvastatin to 80 mg daily  Continue all other medications *If you need a refill on your cardiac medications before your next appointment, please call your pharmacy*   Lab Work: Have Lipid panel and LPa in 3 months   Testing/Procedures: Schedule Lexiscan Myoview   Follow-Up: At [facility], you and your health needs are our priority.  As part of our continuing mission to provide you with exceptional heart care, we have created designated Provider Care Teams.  These Care Teams include your primary Cardiologist (physician) and Advanced Practice Providers (APPs -  Physician Assistants and Nurse Practitioners) who all work together to provide you with the care you need, when you need it.  We recommend signing up for the patient portal called "MyChart".  Sign up information is provided on this After Visit Summary.  MyChart is used to connect with patients for Virtual Visits (Telemedicine).  Patients are able to view lab/test results, encounter notes, upcoming appointments, etc.  Non-urgent messages can be sent to your provider as well.   To learn more about what you can do with MyChart, go to [email or web address].    Your next appointment:  1 year    Call in June to schedule Oct appointment     Provider:  Dr.Hochrein

## 2023-03-02 ENCOUNTER — Telehealth (HOSPITAL_COMMUNITY): Payer: Self-pay | Admitting: *Deleted

## 2023-03-02 NOTE — Telephone Encounter (Signed)
Patient given detailed instructions per Myocardial Perfusion Study Information Sheet for the test on 03/06/2023 at 10:30. Patient notified to arrive 15 minutes early and that it is imperative to arrive on time for appointment to keep from having the test rescheduled.  If you need to cancel or reschedule your appointment, please call the office within 24 hours of your appointment. . Patient verbalized understanding.Daneil Dolin

## 2023-03-03 ENCOUNTER — Encounter (HOSPITAL_COMMUNITY): Payer: BC Managed Care – PPO

## 2023-03-03 LAB — LIPID PANEL
Chol/HDL Ratio: 2.2 ratio (ref 0.0–5.0)
Cholesterol, Total: 119 mg/dL (ref 100–199)
HDL: 54 mg/dL (ref >39)
LDL Chol Calc (NIH): 53 mg/dL (ref 0–99)
Triglycerides: 49 mg/dL (ref 0–149)
VLDL Cholesterol Cal: 12 mg/dL (ref 5–40)

## 2023-03-03 LAB — LIPOPROTEIN A (LPA): Lipoprotein (a): 188.3 nmol/L — ABNORMAL HIGH (ref <75.0)

## 2023-03-06 ENCOUNTER — Ambulatory Visit (HOSPITAL_COMMUNITY): Payer: BC Managed Care – PPO | Attending: Cardiology

## 2023-03-06 ENCOUNTER — Other Ambulatory Visit (HOSPITAL_COMMUNITY): Payer: Self-pay | Admitting: Cardiology

## 2023-03-06 VITALS — Ht 65.5 in | Wt 148.0 lb

## 2023-03-06 DIAGNOSIS — Z720 Tobacco use: Secondary | ICD-10-CM | POA: Insufficient documentation

## 2023-03-06 DIAGNOSIS — R0609 Other forms of dyspnea: Secondary | ICD-10-CM | POA: Diagnosis not present

## 2023-03-06 DIAGNOSIS — Z01810 Encounter for preprocedural cardiovascular examination: Secondary | ICD-10-CM | POA: Diagnosis present

## 2023-03-06 DIAGNOSIS — E785 Hyperlipidemia, unspecified: Secondary | ICD-10-CM | POA: Diagnosis present

## 2023-03-06 DIAGNOSIS — I2510 Atherosclerotic heart disease of native coronary artery without angina pectoris: Secondary | ICD-10-CM | POA: Insufficient documentation

## 2023-03-06 LAB — MYOCARDIAL PERFUSION IMAGING
LV dias vol: 107 mL (ref 62–150)
LV dias vol: 107 mL (ref 62–150)
LV sys vol: 42 mL
LV sys vol: 42 mL
Nuc Stress EF: 60 %
Nuc Stress EF: 60 %
Peak HR: 100 {beats}/min
Peak HR: 115 {beats}/min
Rest HR: 56 {beats}/min
Rest HR: 56 {beats}/min
Rest Nuclear Isotope Dose: 8.8 mCi
Rest Nuclear Isotope Dose: 8.8 mCi
SDS: 1
SDS: 1
SRS: 1
SRS: 1
SSS: 2
SSS: 2
ST Depression (mm): 0 mm
Stress Nuclear Isotope Dose: 30.5 mCi
Stress Nuclear Isotope Dose: 30.5 mCi
TID: 1.04
TID: 1.04

## 2023-03-06 MED ORDER — TECHNETIUM TC 99M TETROFOSMIN IV KIT
8.8000 | PACK | Freq: Once | INTRAVENOUS | Status: AC | PRN
  Administered 2023-03-06: 8.8 via INTRAVENOUS

## 2023-03-06 MED ORDER — AMINOPHYLLINE 25 MG/ML IV SOLN
75.0000 mg | Freq: Once | INTRAVENOUS | Status: AC
  Administered 2023-03-06: 75 mg via INTRAVENOUS

## 2023-03-06 MED ORDER — TECHNETIUM TC 99M TETROFOSMIN IV KIT
30.5000 | PACK | Freq: Once | INTRAVENOUS | Status: AC | PRN
  Administered 2023-03-06: 30.5 via INTRAVENOUS

## 2023-03-06 MED ORDER — REGADENOSON 0.4 MG/5ML IV SOLN
0.4000 mg | Freq: Once | INTRAVENOUS | Status: AC
  Administered 2023-03-06: 0.4 mg via INTRAVENOUS

## 2023-03-07 ENCOUNTER — Encounter: Payer: Self-pay | Admitting: Cardiology

## 2023-03-10 NOTE — Telephone Encounter (Signed)
See previous encounter, patient's question has been sent to Dr. Antoine Poche to address.

## 2023-03-13 ENCOUNTER — Encounter: Payer: Self-pay | Admitting: Cardiology

## 2023-03-13 ENCOUNTER — Encounter: Payer: Self-pay | Admitting: Internal Medicine

## 2023-03-13 ENCOUNTER — Other Ambulatory Visit: Payer: Self-pay

## 2023-03-13 DIAGNOSIS — I2510 Atherosclerotic heart disease of native coronary artery without angina pectoris: Secondary | ICD-10-CM

## 2023-03-13 MED ORDER — CLOPIDOGREL BISULFATE 75 MG PO TABS
75.0000 mg | ORAL_TABLET | Freq: Every day | ORAL | 0 refills | Status: DC
Start: 2023-03-13 — End: 2023-06-26

## 2023-03-13 NOTE — Telephone Encounter (Signed)
RX sent in and ready for PT pickup

## 2023-03-13 NOTE — Telephone Encounter (Signed)
Forwarded to Refills

## 2023-03-16 ENCOUNTER — Encounter: Payer: Self-pay | Admitting: Internal Medicine

## 2023-03-16 ENCOUNTER — Ambulatory Visit: Payer: BC Managed Care – PPO

## 2023-03-16 ENCOUNTER — Ambulatory Visit: Payer: BC Managed Care – PPO | Admitting: Internal Medicine

## 2023-03-16 VITALS — BP 118/70 | HR 62 | Temp 98.5°F | Ht 65.5 in | Wt 140.0 lb

## 2023-03-16 DIAGNOSIS — R042 Hemoptysis: Secondary | ICD-10-CM | POA: Insufficient documentation

## 2023-03-16 DIAGNOSIS — Z720 Tobacco use: Secondary | ICD-10-CM | POA: Diagnosis not present

## 2023-03-16 DIAGNOSIS — R051 Acute cough: Secondary | ICD-10-CM | POA: Insufficient documentation

## 2023-03-16 MED ORDER — AMOXICILLIN-POT CLAVULANATE 875-125 MG PO TABS: 1.0000 | ORAL_TABLET | Freq: Two times a day (BID) | ORAL | 0 refills | Status: DC

## 2023-03-16 MED ORDER — AIRSUPRA 90-80 MCG/ACT IN AERO: 2.0000 | INHALATION_SPRAY | RESPIRATORY_TRACT | 5 refills | Status: AC | PRN

## 2023-03-16 MED ORDER — ACETAMINOPHEN-CODEINE 300-30 MG PO TABS: 1.0000 | ORAL_TABLET | Freq: Four times a day (QID) | ORAL | 0 refills | Status: DC | PRN

## 2023-03-16 MED ORDER — METHYLPREDNISOLONE 4 MG PO TBPK: ORAL_TABLET | ORAL | 0 refills | Status: DC

## 2023-03-16 NOTE — Assessment & Plan Note (Signed)
Mild due to bronchitis vs CAP T#3 prn cough (can't take liquid meds) Airsupra MDI 2 puffs prn Augmentin x 10 d Medrol pack CXR today (he had a hest CT in 6.2024) F/u w/Dr Yetta Barre Stop smoking!

## 2023-03-16 NOTE — Telephone Encounter (Signed)
I was able to schedule the pt for 2:40pm on 03/16/2023 with Dr. Posey Rea.

## 2023-03-16 NOTE — Assessment & Plan Note (Signed)
Bronchitis vs CAP T#3 prn cough (can't take liquid meds) Airsupra MDI 2 puffs prn Augmentin x 10 d Medrol pack CXR today (he had a hest CT in 6.2024)

## 2023-03-16 NOTE — Progress Notes (Signed)
Subjective:  Patient ID: Louis Cardenas, male    DOB: July 30, 1965  Age: 57 y.o. MRN: [id number]  CC: Cough (Pt has had a persistent productive cough x2 weeks a/w some SOB. Pt was taking coricidin to help with the cough. Pt states x1 day ago he started to see bright red blood come up when he coughs a/w some phlegm. Pt states that his phlegm has been very thick. Pt has recently stopped smoking as well./Pt denies N/V/D, fever, chills and sweats. )   HPI Louis Cardenas presents for a persistent productive cough x2 weeks a/w some SOB. Pt was taking coricidin to help with the cough. Pt states x1 day ago he started to see bright red blood come up when he coughs a/w some phlegm. Pt states that his phlegm has been very thick - streaks of blood... Pt has recently stopped smoking as well./Pt denies N/V/D, fever, chills and sweats.  Pt stopped smoking  Outpatient Medications Prior to Visit  Medication Sig Dispense Refill   aspirin 81 MG chewable tablet Chew by mouth daily.     atorvastatin (LIPITOR) 80 MG tablet Take 1 tablet (80 mg total) by mouth daily. 90 tablet 3   clonazePAM (KLONOPIN) 0.5 MG tablet Take 0.5 mg by mouth as needed for anxiety.     clopidogrel (PLAVIX) 75 MG tablet Take 1 tablet (75 mg total) by mouth daily. 90 tablet 0   desonide (DESOWEN) 0.05 % lotion Apply topically daily as needed.     hydrocortisone (ANUSOL-HC) 2.5 % rectal cream Place 1 application rectally 2 (two) times daily. 30 g 1   lamoTRIgine (LAMICTAL) 200 MG tablet Take 200 mg by mouth daily.     lithium 300 MG tablet Take 300 mg by mouth daily.      nitroGLYCERIN (NITROSTAT) 0.4 MG SL tablet PLACE 1 TABLET UNDER THE TONGUE EVERY 5 MINUTES AS NEEDED FOR CHEST PAIN. 25 tablet 3   pantoprazole (PROTONIX) 40 MG tablet TAKE 1 TABLET (40 MG TOTAL) BY MOUTH TWICE A DAY BEFORE MEALS 180 tablet 0   sildenafil (REVATIO) 20 MG tablet Take 4 tablets (80 mg total) by mouth daily as needed. 60 tablet 2   testosterone  (ANDROGEL) 50 MG/5GM (1%) GEL PLACE 5GRAMS ONTO THE SKIN DAILY. 450 g 0   TRINTELLIX 10 MG TABS tablet Take 10 mg by mouth daily.     naloxone (NARCAN) nasal spray 4 mg/0.1 mL Spray into nostril as needed for accidental opiate overdose (follow instructions on vial). (Patient not taking: Reported on 02/22/2023) 1 each 0   No facility-administered medications prior to visit.    ROS: Review of Systems  Constitutional:  Positive for chills and fatigue. Negative for appetite change and unexpected weight change.  HENT:  Positive for congestion. Negative for nosebleeds, sneezing, sore throat and trouble swallowing.   Eyes:  Negative for itching and visual disturbance.  Respiratory:  Positive for cough, chest tightness, shortness of breath and wheezing.   Cardiovascular:  Negative for chest pain, palpitations and leg swelling.  Gastrointestinal:  Negative for abdominal distention, blood in stool, diarrhea and nausea.  Genitourinary:  Negative for frequency and hematuria.  Musculoskeletal:  Positive for arthralgias. Negative for back pain, gait problem, joint swelling and neck pain.  Skin:  Negative for rash.  Neurological:  Negative for dizziness, tremors, speech difficulty and weakness.  Psychiatric/Behavioral:  Positive for agitation. Negative for dysphoric mood, sleep disturbance and suicidal ideas. The patient is not nervous/anxious.     Objective:  BP 118/70 (BP Location: Left Arm, Patient Position: Sitting, Cuff Size: Normal)   Pulse 62   Temp 98.5 F (36.9 C) (Oral)   Ht 5' 5.5" (1.664 m)   Wt 140 lb (63.5 kg)   SpO2 99%   BMI 22.94 kg/m   BP Readings from Last 3 Encounters:  03/16/23 118/70  02/22/23 124/84  12/14/22 127/70    Wt Readings from Last 3 Encounters:  03/16/23 140 lb (63.5 kg)  03/06/23 148 lb (67.1 kg)  02/22/23 148 lb 3.2 oz (67.2 kg)    Physical Exam Constitutional:      General: He is not in acute distress.    Appearance: He is well-developed. He is not  toxic-appearing.     Comments: NAD  Eyes:     Conjunctiva/sclera: Conjunctivae normal.     Pupils: Pupils are equal, round, and reactive to light.  Neck:     Thyroid: No thyromegaly.     Vascular: No JVD.  Cardiovascular:     Rate and Rhythm: Normal rate and regular rhythm.     Heart sounds: Normal heart sounds. No murmur heard.    No friction rub. No gallop.  Pulmonary:     Effort: Pulmonary effort is normal. No respiratory distress.     Breath sounds: Normal breath sounds. No wheezing or rales.  Chest:     Chest wall: No tenderness.  Abdominal:     General: Bowel sounds are normal. There is no distension.     Palpations: Abdomen is soft. There is no mass.     Tenderness: There is no abdominal tenderness. There is no guarding or rebound.  Musculoskeletal:        General: No tenderness. Normal range of motion.     Cervical back: Normal range of motion.  Lymphadenopathy:     Cervical: No cervical adenopathy.  Skin:    General: Skin is warm and dry.     Findings: No rash.  Neurological:     Mental Status: He is alert and oriented to person, place, and time.     Cranial Nerves: No cranial nerve deficit.     Motor: No abnormal muscle tone.     Coordination: Coordination normal.     Gait: Gait normal.     Deep Tendon Reflexes: Reflexes are normal and symmetric.  Psychiatric:        Behavior: Behavior normal.        Thought Content: Thought content normal.        Judgment: Judgment normal.    Hard cough Coarse BS B   Lab Results  Component Value Date   WBC 6.5 12/01/2022   HGB 14.2 12/01/2022   HCT 43.9 12/01/2022   PLT 286.0 12/01/2022   GLUCOSE 102 (H) 12/01/2022   CHOL 119 03/02/2023   TRIG 49 03/02/2023   HDL 54 03/02/2023   LDLCALC 53 03/02/2023   ALT 17 12/01/2022   AST 17 12/01/2022   NA 141 12/01/2022   K 4.3 12/01/2022   CL 108 12/01/2022   CREATININE 1.44 12/01/2022   BUN 18 12/01/2022   CO2 27 12/01/2022   TSH 0.64 05/10/2022   PSA 0.50  05/24/2022   INR 1.0 08/16/2021    No results found.  Assessment & Plan:   Problem List Items Addressed This Visit     Tobacco abuse    Pt is trying to quit again      Acute cough - Primary    Bronchitis vs CAP T#3 prn  cough (can't take liquid meds) Airsupra MDI 2 puffs prn Augmentin x 10 d Medrol pack CXR today (he had a hest CT in 6.2024)      Relevant Orders   DG Chest 2 View   Hemoptysis    Mild due to bronchitis vs CAP T#3 prn cough (can't take liquid meds) Airsupra MDI 2 puffs prn Augmentin x 10 d Medrol pack CXR today (he had a hest CT in 6.2024) F/u w/Dr Yetta Barre Stop smoking!         Meds ordered this encounter  Medications   acetaminophen-codeine (TYLENOL #3) 300-30 MG tablet    Sig: Take 1 tablet by mouth every 6 (six) hours as needed for moderate pain (pain score 4-6).    Dispense:  20 tablet    Refill:  0   amoxicillin-clavulanate (AUGMENTIN) 875-125 MG tablet    Sig: Take 1 tablet by mouth 2 (two) times daily.    Dispense:  20 tablet    Refill:  0   methylPREDNISolone (MEDROL DOSEPAK) 4 MG TBPK tablet    Sig: As directed    Dispense:  21 tablet    Refill:  0   Albuterol-Budesonide (AIRSUPRA) 90-80 MCG/ACT AERO    Sig: Inhale 2 Inhalations into the lungs every 4 (four) hours as needed.    Dispense:  10 g    Refill:  5      Follow-up: No follow-ups on file.  Sonda Primes, MD

## 2023-03-16 NOTE — Assessment & Plan Note (Signed)
Pt is trying to quit again

## 2023-04-09 ENCOUNTER — Other Ambulatory Visit: Payer: Self-pay | Admitting: Cardiology

## 2023-04-09 ENCOUNTER — Other Ambulatory Visit: Payer: Self-pay | Admitting: Internal Medicine

## 2023-04-09 DIAGNOSIS — E291 Testicular hypofunction: Secondary | ICD-10-CM

## 2023-04-14 ENCOUNTER — Other Ambulatory Visit: Payer: Self-pay | Admitting: Cardiology

## 2023-05-05 ENCOUNTER — Other Ambulatory Visit (HOSPITAL_COMMUNITY): Payer: Self-pay

## 2023-05-15 ENCOUNTER — Other Ambulatory Visit (HOSPITAL_COMMUNITY): Payer: Self-pay

## 2023-05-25 ENCOUNTER — Other Ambulatory Visit: Payer: Self-pay | Admitting: Internal Medicine

## 2023-05-25 DIAGNOSIS — K2101 Gastro-esophageal reflux disease with esophagitis, with bleeding: Secondary | ICD-10-CM

## 2023-06-09 ENCOUNTER — Other Ambulatory Visit: Payer: Self-pay

## 2023-06-09 ENCOUNTER — Other Ambulatory Visit: Payer: Self-pay | Admitting: Cardiology

## 2023-06-09 ENCOUNTER — Other Ambulatory Visit: Payer: Self-pay | Admitting: Internal Medicine

## 2023-06-09 DIAGNOSIS — I2510 Atherosclerotic heart disease of native coronary artery without angina pectoris: Secondary | ICD-10-CM

## 2023-06-09 MED ORDER — ATORVASTATIN CALCIUM 80 MG PO TABS: 80.0000 mg | ORAL_TABLET | Freq: Every day | ORAL | 3 refills | Status: DC

## 2023-06-09 MED ORDER — ATORVASTATIN CALCIUM 80 MG PO TABS: 80.0000 mg | ORAL_TABLET | Freq: Every day | ORAL | 3 refills | Status: AC

## 2023-06-09 NOTE — Addendum Note (Signed)
Addended by: Jeannette How A on: 06/09/2023 01:59 PM   Modules accepted: Orders

## 2023-06-20 ENCOUNTER — Telehealth: Payer: Self-pay

## 2023-06-20 ENCOUNTER — Other Ambulatory Visit (HOSPITAL_COMMUNITY): Payer: Self-pay

## 2023-06-20 NOTE — Telephone Encounter (Signed)
 Pharmacy Patient Advocate Encounter  Received notification from [facility] that Prior Authorization for Sildenafil Citrate (PAH) 20MG  tablets has been DENIED.  See denial reason below. No denial letter attached in CMM. Will attach denial letter to Media tab once received.   PA #/Case ID/Reference #: [id number]

## 2023-06-20 NOTE — Telephone Encounter (Signed)
 Pharmacy Patient Advocate Encounter   Received notification from CoverMyMeds that prior authorization for Sildenafil Citrate (PAH) 20MG  tablets is required/requested.   Insurance verification completed.   The patient is insured through [facility] .   Per test claim: PA required; PA submitted to above mentioned insurance via CoverMyMeds Key/confirmation #/EOC [id number] Status is pending

## 2023-06-21 NOTE — Telephone Encounter (Signed)
 Patient has been made aware.

## 2023-06-22 ENCOUNTER — Other Ambulatory Visit (HOSPITAL_COMMUNITY): Payer: Self-pay

## 2023-06-23 ENCOUNTER — Other Ambulatory Visit: Payer: Self-pay | Admitting: Internal Medicine

## 2023-06-23 DIAGNOSIS — I2510 Atherosclerotic heart disease of native coronary artery without angina pectoris: Secondary | ICD-10-CM

## 2023-06-23 NOTE — Telephone Encounter (Unsigned)
 Copied from CRM [id number]. Topic: Clinical - Medication Refill >> Jun 23, 2023  5:14 PM Alcus Dad wrote: Most Recent Primary Care Visit:  Provider: Tresa Garter  Department: [facility]  Visit Type: SAME DAY  Date: 03/16/2023  Medication: clopidogrel (PLAVIX) 75 MG tablet  Has the patient contacted their pharmacy? Yes (Agent: If no, request that the patient contact the pharmacy for the refill. If patient does not wish to contact the pharmacy document the reason why and proceed with request.) (Agent: If yes, when and what did the pharmacy advise?)  Is this the correct pharmacy for this prescription? Yes If no, delete pharmacy and type the correct one.  This is the patient's preferred pharmacy:   [id number] [place], [place] - [address] [phone]  Has the prescription been filled recently? No  Is the patient out of the medication? Yes  Has the patient been seen for an appointment in the last year OR does the patient have an upcoming appointment? Yes  Can we respond through MyChart? Yes  Agent: Please be advised that Rx refills may take up to 3 business days. We ask that you follow-up with your pharmacy.

## 2023-06-26 ENCOUNTER — Encounter: Payer: Self-pay | Admitting: Internal Medicine

## 2023-06-26 ENCOUNTER — Encounter: Payer: Self-pay | Admitting: Cardiology

## 2023-06-26 ENCOUNTER — Other Ambulatory Visit: Payer: Self-pay | Admitting: Internal Medicine

## 2023-06-26 DIAGNOSIS — I2510 Atherosclerotic heart disease of native coronary artery without angina pectoris: Secondary | ICD-10-CM

## 2023-06-26 MED ORDER — CLOPIDOGREL BISULFATE 75 MG PO TABS
75.0000 mg | ORAL_TABLET | Freq: Every day | ORAL | 2 refills | Status: DC
Start: 2023-06-26 — End: 2024-03-05

## 2023-07-07 ENCOUNTER — Encounter: Payer: Self-pay | Admitting: Cardiology

## 2023-07-07 ENCOUNTER — Ambulatory Visit: Payer: BC Managed Care – PPO | Attending: Cardiology | Admitting: Cardiology

## 2023-07-07 VITALS — BP 122/84 | HR 78 | Ht 65.5 in | Wt 149.0 lb

## 2023-07-07 DIAGNOSIS — I2510 Atherosclerotic heart disease of native coronary artery without angina pectoris: Secondary | ICD-10-CM | POA: Diagnosis not present

## 2023-07-07 NOTE — Progress Notes (Signed)
  Cardiology Office Note:   Date:  07/07/2023  ID:  Louis Cardenas, DOB 01/31/1966, MRN [id number] PCP: Etta Grandchild, MD  [facility] Providers Cardiologist:  Rollene Rotunda, MD {  History of Present Illness:   Louis Cardenas is a 58 y.o. male who was referred by Etta Grandchild, MD for evaluation of CAD. He had a myocardial infarction 2011 in [place].  He had some chest pain previously.  I sent him for a perfusion study and there was no high risk findings.  There was some gut artifact making it a little bit difficult to interpret.  I brought him back to discuss this.  This demonstrated no transient ischemic dilatation.  He had a well-preserved ejection fraction.  There was a partially reversible defect thought to be artifact related to some gut uptake.  At this time he is not having any new symptoms.  He has lots of vague complaints with some sporadic arm discomfort or fleeting chest pain but none of it is consistent, reproducible or happening with exertion.  None of it is reminiscent of his previous angina.  He has good days and bad days.  He has lots of stress and anxiety.  ROS: As stated in the HPI and negative for all other systems.  Studies Reviewed:    EKG:   NA  Risk Assessment/Calculations:              Physical Exam:   VS:  BP 122/84   Pulse 78   Ht 5' 5.5" (1.664 m)   Wt 149 lb (67.6 kg)   SpO2 99%   BMI 24.42 kg/m    Wt Readings from Last 3 Encounters:  07/07/23 149 lb (67.6 kg)  03/16/23 140 lb (63.5 kg)  03/06/23 148 lb (67.1 kg)     GEN: Well nourished, well developed in no acute distress NECK: No JVD; No carotid bruits CARDIAC: RRR, no murmurs, rubs, gallops RESPIRATORY:  Clear to auscultation without rales, wheezing or rhonchi  ABDOMEN: Soft, non-tender, non-distended EXTREMITIES:  No edema; No deformity   ASSESSMENT AND PLAN:   CAD:   The patient has no new symptoms that would suggest need for cardiac cath.  We had a long  discussion about this and he knows to call me with any increased symptoms.    DYSLIPIDEMIA: LDL was up to 53 with an HDL of 54.  He does have an elevated LP(a) and I will keep him on the line for future therapies.  Continue current medications.   TOBACCO ABUSE:   We have been through discussions on this.  He is still trying to cut back.        Follow up with me in one year.   Signed, Rollene Rotunda, MD

## 2023-07-07 NOTE — Patient Instructions (Signed)

## 2023-07-24 ENCOUNTER — Other Ambulatory Visit: Payer: Self-pay | Admitting: Internal Medicine

## 2023-07-24 DIAGNOSIS — E291 Testicular hypofunction: Secondary | ICD-10-CM

## 2023-07-28 ENCOUNTER — Other Ambulatory Visit: Payer: Self-pay | Admitting: Cardiology

## 2023-08-10 ENCOUNTER — Other Ambulatory Visit: Payer: Self-pay | Admitting: Cardiology

## 2023-08-10 ENCOUNTER — Other Ambulatory Visit: Payer: Self-pay | Admitting: Internal Medicine

## 2023-08-10 DIAGNOSIS — E291 Testicular hypofunction: Secondary | ICD-10-CM

## 2023-08-11 ENCOUNTER — Other Ambulatory Visit: Payer: Self-pay | Admitting: Cardiology

## 2023-08-24 ENCOUNTER — Other Ambulatory Visit: Payer: Self-pay | Admitting: Cardiology

## 2023-09-29 ENCOUNTER — Other Ambulatory Visit: Payer: Self-pay | Admitting: Internal Medicine

## 2023-09-29 DIAGNOSIS — K2101 Gastro-esophageal reflux disease with esophagitis, with bleeding: Secondary | ICD-10-CM

## 2023-10-04 ENCOUNTER — Other Ambulatory Visit: Payer: Self-pay

## 2023-10-04 DIAGNOSIS — F17210 Nicotine dependence, cigarettes, uncomplicated: Secondary | ICD-10-CM

## 2023-10-04 DIAGNOSIS — Z87891 Personal history of nicotine dependence: Secondary | ICD-10-CM

## 2023-10-04 DIAGNOSIS — Z122 Encounter for screening for malignant neoplasm of respiratory organs: Secondary | ICD-10-CM

## 2023-10-05 ENCOUNTER — Other Ambulatory Visit: Payer: Self-pay | Admitting: Cardiology

## 2023-10-10 ENCOUNTER — Encounter: Payer: Self-pay | Admitting: Acute Care

## 2023-10-12 ENCOUNTER — Encounter: Payer: Self-pay | Admitting: Internal Medicine

## 2023-10-12 ENCOUNTER — Ambulatory Visit (INDEPENDENT_AMBULATORY_CARE_PROVIDER_SITE_OTHER): Admitting: Internal Medicine

## 2023-10-12 ENCOUNTER — Ambulatory Visit
Admission: RE | Admit: 2023-10-12 | Discharge: 2023-10-12 | Disposition: A | Discharge status: Home / Self Care | Source: Ambulatory Visit | Attending: Acute Care | Admitting: Acute Care

## 2023-10-12 ENCOUNTER — Ambulatory Visit: Admission: RE | Admit: 2023-10-12 | Source: Ambulatory Visit

## 2023-10-12 ENCOUNTER — Ambulatory Visit: Payer: Self-pay | Admitting: Internal Medicine

## 2023-10-12 VITALS — BP 138/82 | HR 61 | Temp 98.2°F | Resp 16 | Ht 65.5 in | Wt 142.4 lb

## 2023-10-12 DIAGNOSIS — I10 Essential (primary) hypertension: Secondary | ICD-10-CM

## 2023-10-12 DIAGNOSIS — I2510 Atherosclerotic heart disease of native coronary artery without angina pectoris: Secondary | ICD-10-CM | POA: Diagnosis not present

## 2023-10-12 DIAGNOSIS — E785 Hyperlipidemia, unspecified: Secondary | ICD-10-CM

## 2023-10-12 DIAGNOSIS — N1831 Chronic kidney disease, stage 3a: Secondary | ICD-10-CM

## 2023-10-12 DIAGNOSIS — F17210 Nicotine dependence, cigarettes, uncomplicated: Secondary | ICD-10-CM

## 2023-10-12 DIAGNOSIS — Z122 Encounter for screening for malignant neoplasm of respiratory organs: Secondary | ICD-10-CM

## 2023-10-12 DIAGNOSIS — Z125 Encounter for screening for malignant neoplasm of prostate: Secondary | ICD-10-CM | POA: Diagnosis not present

## 2023-10-12 DIAGNOSIS — Z0001 Encounter for general adult medical examination with abnormal findings: Secondary | ICD-10-CM

## 2023-10-12 DIAGNOSIS — D699 Hemorrhagic condition, unspecified: Secondary | ICD-10-CM | POA: Diagnosis not present

## 2023-10-12 DIAGNOSIS — Z87891 Personal history of nicotine dependence: Secondary | ICD-10-CM

## 2023-10-12 LAB — LIPID PANEL
Cholesterol: 116 mg/dL (ref 0–200)
HDL: 45.9 mg/dL (ref >39.00)
LDL Cholesterol: 61 mg/dL (ref 0–99)
NonHDL: 70.58
Total CHOL/HDL Ratio: 3
Triglycerides: 50 mg/dL (ref 0.0–149.0)
VLDL: 10 mg/dL (ref 0.0–40.0)

## 2023-10-12 LAB — HEPATIC FUNCTION PANEL
ALT: 22 U/L (ref 0–53)
AST: 19 U/L (ref 0–37)
Albumin: 4.8 g/dL (ref 3.5–5.2)
Alkaline Phosphatase: 97 U/L (ref 39–117)
Bilirubin, Direct: 0.1 mg/dL (ref 0.0–0.3)
Total Bilirubin: 0.6 mg/dL (ref 0.2–1.2)
Total Protein: 7 g/dL (ref 6.0–8.3)

## 2023-10-12 LAB — CBC WITH DIFFERENTIAL/PLATELET
Basophils Absolute: 0.2 10*3/uL — ABNORMAL HIGH (ref 0.0–0.1)
Basophils Relative: 2.2 % (ref 0.0–3.0)
Eosinophils Absolute: 0.3 10*3/uL (ref 0.0–0.7)
Eosinophils Relative: 3.5 % (ref 0.0–5.0)
HCT: 40.9 % (ref 39.0–52.0)
Hemoglobin: 13.3 g/dL (ref 13.0–17.0)
Lymphocytes Relative: 17.5 % (ref 12.0–46.0)
Lymphs Abs: 1.3 10*3/uL (ref 0.7–4.0)
MCHC: 32.5 g/dL (ref 30.0–36.0)
MCV: 76.7 fl — ABNORMAL LOW (ref 78.0–100.0)
Monocytes Absolute: 0.5 10*3/uL (ref 0.1–1.0)
Monocytes Relative: 7 % (ref 3.0–12.0)
Neutro Abs: 5.3 10*3/uL (ref 1.4–7.7)
Neutrophils Relative %: 69.8 % (ref 43.0–77.0)
Platelets: 329 10*3/uL (ref 150.0–400.0)
RBC: 5.33 Mil/uL (ref 4.22–5.81)
RDW: 18.4 % — ABNORMAL HIGH (ref 11.5–15.5)
WBC: 7.6 10*3/uL (ref 4.0–10.5)

## 2023-10-12 LAB — URINALYSIS, ROUTINE W REFLEX MICROSCOPIC
Bilirubin Urine: NEGATIVE
Hgb urine dipstick: NEGATIVE
Ketones, ur: NEGATIVE
Leukocytes,Ua: NEGATIVE
Nitrite: NEGATIVE
RBC / HPF: NONE SEEN (ref 0–?)
Specific Gravity, Urine: 1.01 (ref 1.000–1.030)
Urine Glucose: NEGATIVE
Urobilinogen, UA: 0.2 (ref 0.0–1.0)
WBC, UA: NONE SEEN (ref 0–?)
pH: 6.5 (ref 5.0–8.0)

## 2023-10-12 LAB — TSH: TSH: 0.43 u[IU]/mL (ref 0.35–5.50)

## 2023-10-12 LAB — PSA: PSA: 0.55 ng/mL (ref 0.10–4.00)

## 2023-10-12 LAB — BASIC METABOLIC PANEL WITH GFR
BUN: 21 mg/dL (ref 6–23)
CO2: 29 meq/L (ref 19–32)
Calcium: 10 mg/dL (ref 8.4–10.5)
Chloride: 107 meq/L (ref 96–112)
Creatinine, Ser: 1.34 mg/dL (ref 0.40–1.50)
GFR: 59.34 mL/min — ABNORMAL LOW (ref >60.00)
Glucose, Bld: 78 mg/dL (ref 70–99)
Potassium: 4.2 meq/L (ref 3.5–5.1)
Sodium: 141 meq/L (ref 135–145)

## 2023-10-12 LAB — PROTIME-INR
INR: 1 ratio (ref 0.8–1.0)
Prothrombin Time: 11.1 s (ref 9.6–13.1)

## 2023-10-12 LAB — APTT: aPTT: 31.6 s (ref 25.4–36.8)

## 2023-10-12 MED ORDER — EMPAGLIFLOZIN 10 MG PO TABS: 10.0000 mg | ORAL_TABLET | Freq: Every day | ORAL | 1 refills | Status: DC

## 2023-10-12 NOTE — Progress Notes (Unsigned)
 Subjective:  Patient ID: Louis Cardenas, male    DOB: [birth year]  Age: 58 y.o. MRN: [id number]  CC: Annual Exam, Hypertension, and Coronary Artery Disease   HPI Louis Cardenas presents for a CPX and f/up ----  Discussed the use of AI scribe software for clinical note transcription with the patient, who gave verbal consent to proceed.  History of Present Illness   Louis Cardenas is a 58 year old male who presents with concerns about increased bleeding and bruising after a change in medication dosage.  He has experienced increased bleeding and bruising after his medication dosage was doubled. His blood 'just flows like water' and he bleeds easily from cuts or bites. There are no epistaxis or hematuria, but significant bruising and bleeding occur from minor injuries.  He describes a recent incident where his [place] shepherd punctured a couple of veins, leading to significant bleeding that required self-bandaging for several days. He had to remove the bandage after three to four days to allow the wound to air out.  He has a history of cardiac issues and previously underwent a nuclear stress test. Following the test results, his cardiologist decided to adjust his medication dosage.  He is currently unemployed but holds a broker's license. He has been renovating his mother's house since her passing in April 2025, which has been a significant focus of his time. He has a therapist in [place]  and has been spending time there.  No chest pain, shortness of breath, epistaxis, or hematuria.       Outpatient Medications Prior to Visit  Medication Sig Dispense Refill   Albuterol-Budesonide (AIRSUPRA ) 90-80 MCG/ACT AERO Inhale 2 Inhalations into the lungs every 4 (four) hours as needed. 10 g 5   aspirin 81 MG chewable tablet Chew by mouth daily.     atorvastatin  (LIPITOR) 80 MG tablet Take 1 tablet (80 mg total) by mouth daily. 90 tablet 3   clonazePAM (KLONOPIN) 0.5  MG tablet Take 0.5 mg by mouth as needed for anxiety.     clopidogrel  (PLAVIX ) 75 MG tablet Take 1 tablet (75 mg total) by mouth daily. 90 tablet 2   desonide (DESOWEN) 0.05 % lotion Apply topically daily as needed.     hydrocortisone  (ANUSOL -HC) 2.5 % rectal cream Place 1 application rectally 2 (two) times daily. 30 g 1   lamoTRIgine  (LAMICTAL ) 200 MG tablet Take 200 mg by mouth daily.     lithium  300 MG tablet Take 300 mg by mouth daily.      nitroGLYCERIN  (NITROSTAT ) 0.4 MG SL tablet PLACE 1 TABLET UNDER THE TONGUE EVERY 5 MINUTES AS NEEDED FOR CHEST PAIN. 75 tablet 2   pantoprazole  (PROTONIX ) 40 MG tablet TAKE 1 TABLET (40 MG TOTAL) BY MOUTH TWICE A DAY BEFORE MEALS 180 tablet 0   sildenafil  (REVATIO ) 20 MG tablet Take 4 tablets (80 mg total) by mouth daily as needed. 60 tablet 2   testosterone  (ANDROGEL ) 50 MG/5GM (1%) GEL PLACE 5GRAMS ONTO THE SKIN DAILY. 450 g 0   TRINTELLIX 10 MG TABS tablet Take 10 mg by mouth daily.     No facility-administered medications prior to visit.    ROS Review of Systems  Constitutional:  Negative for appetite change, chills, diaphoresis, fatigue and fever.  HENT: Negative.  Negative for nosebleeds, sinus pressure, sore throat and trouble swallowing.   Eyes:  Negative for discharge and visual disturbance.  Respiratory: Negative.  Negative for cough,  chest tightness, shortness of breath and wheezing.   Cardiovascular:  Negative for chest pain, palpitations and leg swelling.  Gastrointestinal: Negative.  Negative for abdominal pain, blood in stool, constipation, diarrhea, nausea and vomiting.  Genitourinary:  Negative for difficulty urinating, dysuria, hematuria, scrotal swelling and testicular pain.  Musculoskeletal: Negative.  Negative for arthralgias and myalgias.  Skin:  Positive for wound. Negative for color change, pallor and rash.  Neurological: Negative.  Negative for dizziness, weakness and light-headedness.  Hematological:  Negative for  adenopathy. Bruises/bleeds easily.  Psychiatric/Behavioral: Negative.      Objective:  BP 138/82 (BP Location: Left Arm, Patient Position: Sitting, Cuff Size: Small)   Pulse 61   Temp 98.2 F (36.8 C) (Oral)   Resp 16   Ht 5' 5.5 (1.664 m)   Wt 142 lb 6.4 oz (64.6 kg)   SpO2 99%   BMI 23.34 kg/m   BP Readings from Last 3 Encounters:  10/12/23 138/82  07/07/23 122/84  03/16/23 118/70    Wt Readings from Last 3 Encounters:  10/12/23 142 lb 6.4 oz (64.6 kg)  07/07/23 149 lb (67.6 kg)  03/16/23 140 lb (63.5 kg)    Physical Exam Vitals reviewed.  Constitutional:      Appearance: Normal appearance. He is not ill-appearing.  HENT:     Nose: Nose normal.     Mouth/Throat:     Mouth: Mucous membranes are moist.   Eyes:     General: No scleral icterus.    Conjunctiva/sclera: Conjunctivae normal.    Cardiovascular:     Rate and Rhythm: Regular rhythm. Bradycardia present.     Heart sounds: No murmur heard.    No friction rub. No gallop.     Comments: EKG---- SB, 53 bpm No LVH, Q waves, or ST/T wave changes  Unchanged  Pulmonary:     Effort: Pulmonary effort is normal.     Breath sounds: No stridor. No wheezing, rhonchi or rales.  Abdominal:     General: Abdomen is flat.     Palpations: There is no mass.     Tenderness: There is no abdominal tenderness. There is no guarding.     Hernia: No hernia is present. There is no hernia in the left inguinal area or right inguinal area.  Genitourinary:    Pubic Area: No rash.      Penis: Normal and circumcised.      Testes: Normal.     Epididymis:     Right: Normal.     Left: Normal.     Prostate: Normal. Not enlarged, not tender and no nodules present.     Rectum: Normal. Guaiac result negative. No mass, tenderness, anal fissure, external hemorrhoid or internal hemorrhoid. Normal anal tone.   Musculoskeletal:        General: Normal range of motion.     Cervical back: Neck supple.     Right lower leg: No edema.      Left lower leg: No edema.  Lymphadenopathy:     Cervical: No cervical adenopathy.     Lower Body: No right inguinal adenopathy. No left inguinal adenopathy.   Skin:    Coloration: Skin is not jaundiced or pale.     Findings: Bruising present. No lesion or rash.   Neurological:     General: No focal deficit present.     Mental Status: He is alert. Mental status is at baseline.   Psychiatric:        Mood and Affect: Mood normal.  Behavior: Behavior normal.     Lab Results  Component Value Date   WBC 7.6 10/12/2023   HGB 13.3 10/12/2023   HCT 40.9 10/12/2023   PLT 329.0 10/12/2023   GLUCOSE 78 10/12/2023   CHOL 116 10/12/2023   TRIG 50.0 10/12/2023   HDL 45.90 10/12/2023   LDLCALC 61 10/12/2023   ALT 22 10/12/2023   AST 19 10/12/2023   NA 141 10/12/2023   K 4.2 10/12/2023   CL 107 10/12/2023   CREATININE 1.34 10/12/2023   BUN 21 10/12/2023   CO2 29 10/12/2023   TSH 0.43 10/12/2023   PSA 0.55 10/12/2023   INR 1.0 10/12/2023    No results found.  Assessment & Plan:  Bleeding disorder (HCC)- C/w DAPT. -     CBC with Differential/Platelet; Future -     Protime-INR; Future -     APTT; Future  Stage 3a chronic kidney disease (HCC)- Will avoid nephrotoxic agents. Will start an SGLT-2 inh. -     Basic metabolic panel with GFR; Future -     Urinalysis, Routine w reflex microscopic; Future -     Empagliflozin; Take 1 tablet (10 mg total) by mouth daily before breakfast.  Dispense: 90 tablet; Refill: 1  Hyperlipidemia with target LDL less than 70- LDL goal achieved. Doing well on the statin  -     TSH; Future -     Hepatic function panel; Future -     Lipid panel; Future  Encounter for general adult medical examination with abnormal findings- Exam completed, labs reviewed, vaccines reviewed (he refused), cancer screenings addressed, pt ed material was given.  -     PSA; Future  Primary hypertension- BP is well controlled. -     EKG 12-Lead  Coronary artery  disease involving native coronary artery of native heart without angina pectoris -     EKG 12-Lead     Follow-up: Return in about 6 months (around 04/12/2024).  Sandra Crouch, MD

## 2023-10-12 NOTE — Patient Instructions (Signed)
 Health Maintenance, Male  Adopting a healthy lifestyle and getting preventive care are important in promoting health and wellness. Ask your health care provider about:  The right schedule for you to have regular tests and exams.  Things you can do on your own to prevent diseases and keep yourself healthy.  What should I know about diet, weight, and exercise?  Eat a healthy diet    Eat a diet that includes plenty of vegetables, fruits, low-fat dairy products, and lean protein.  Do not eat a lot of foods that are high in solid fats, added sugars, or sodium.  Maintain a healthy weight  Body mass index (BMI) is a measurement that can be used to identify possible weight problems. It estimates body fat based on height and weight. Your health care provider can help determine your BMI and help you achieve or maintain a healthy weight.  Get regular exercise  Get regular exercise. This is one of the most important things you can do for your health. Most adults should:  Exercise for at least 150 minutes each week. The exercise should increase your heart rate and make you sweat (moderate-intensity exercise).  Do strengthening exercises at least twice a week. This is in addition to the moderate-intensity exercise.  Spend less time sitting. Even light physical activity can be beneficial.  Watch cholesterol and blood lipids  Have your blood tested for lipids and cholesterol at 56 years of age, then have this test every 5 years.  You may need to have your cholesterol levels checked more often if:  Your lipid or cholesterol levels are high.  You are older than 56 years of age.  You are at high risk for heart disease.  What should I know about cancer screening?  Many types of cancers can be detected early and may often be prevented. Depending on your health history and family history, you may need to have cancer screening at various ages. This may include screening for:  Colorectal cancer.  Prostate cancer.  Skin cancer.  Lung  cancer.  What should I know about heart disease, diabetes, and high blood pressure?  Blood pressure and heart disease  High blood pressure causes heart disease and increases the risk of stroke. This is more likely to develop in people who have high blood pressure readings or are overweight.  Talk with your health care provider about your target blood pressure readings.  Have your blood pressure checked:  Every 3-5 years if you are 9-95 years of age.  Every year if you are 85 years old or older.  If you are between the ages of 29 and 29 and are a current or former smoker, ask your health care provider if you should have a one-time screening for abdominal aortic aneurysm (AAA).  Diabetes  Have regular diabetes screenings. This checks your fasting blood sugar level. Have the screening done:  Once every three years after age 23 if you are at a normal weight and have a low risk for diabetes.  More often and at a younger age if you are overweight or have a high risk for diabetes.  What should I know about preventing infection?  Hepatitis B  If you have a higher risk for hepatitis B, you should be screened for this virus. Talk with your health care provider to find out if you are at risk for hepatitis B infection.  Hepatitis C  Blood testing is recommended for:  Everyone born from [birth year] through 1963.  Anyone  with known risk factors for hepatitis C.  Sexually transmitted infections (STIs)  You should be screened each year for STIs, including gonorrhea and chlamydia, if:  You are sexually active and are younger than 56 years of age.  You are older than 56 years of age and your health care provider tells you that you are at risk for this type of infection.  Your sexual activity has changed since you were last screened, and you are at increased risk for chlamydia or gonorrhea. Ask your health care provider if you are at risk.  Ask your health care provider about whether you are at high risk for HIV. Your health care provider  may recommend a prescription medicine to help prevent HIV infection. If you choose to take medicine to prevent HIV, you should first get tested for HIV. You should then be tested every 3 months for as long as you are taking the medicine.  Follow these instructions at home:  Alcohol use  Do not drink alcohol if your health care provider tells you not to drink.  If you drink alcohol:  Limit how much you have to 0-2 drinks a day.  Know how much alcohol is in your drink. In the [place] one drink equals one 12 oz bottle of beer (355 mL), one 5 oz glass of wine (148 mL), or one 1 oz glass of hard liquor (44 mL).  Lifestyle  Do not use any products that contain nicotine or tobacco. These products include cigarettes, chewing tobacco, and vaping devices, such as e-cigarettes. If you need help quitting, ask your health care provider.  Do not use street drugs.  Do not share needles.  Ask your health care provider for help if you need support or information about quitting drugs.  General instructions  Schedule regular health, dental, and eye exams.  Stay current with your vaccines.  Tell your health care provider if:  You often feel depressed.  You have ever been abused or do not feel safe at home.  Summary  Adopting a healthy lifestyle and getting preventive care are important in promoting health and wellness.  Follow your health care provider's instructions about healthy diet, exercising, and getting tested or screened for diseases.  Follow your health care provider's instructions on monitoring your cholesterol and blood pressure.  This information is not intended to replace advice given to you by your health care provider. Make sure you discuss any questions you have with your health care provider.  Document Revised: 08/31/2020 Document Reviewed: 08/31/2020  Elsevier Patient Education  2024 [employer].

## 2023-10-16 ENCOUNTER — Encounter: Payer: Self-pay | Admitting: Internal Medicine

## 2023-10-17 ENCOUNTER — Other Ambulatory Visit: Payer: Self-pay | Admitting: Internal Medicine

## 2023-10-17 DIAGNOSIS — K635 Polyp of colon: Secondary | ICD-10-CM

## 2023-10-18 ENCOUNTER — Ambulatory Visit

## 2023-10-24 ENCOUNTER — Encounter: Payer: Self-pay | Admitting: Cardiology

## 2023-10-25 ENCOUNTER — Encounter: Admitting: Internal Medicine

## 2023-10-25 ENCOUNTER — Other Ambulatory Visit: Payer: Self-pay | Admitting: Acute Care

## 2023-10-25 DIAGNOSIS — Z87891 Personal history of nicotine dependence: Secondary | ICD-10-CM

## 2023-10-25 DIAGNOSIS — Z122 Encounter for screening for malignant neoplasm of respiratory organs: Secondary | ICD-10-CM

## 2023-10-25 DIAGNOSIS — F17210 Nicotine dependence, cigarettes, uncomplicated: Secondary | ICD-10-CM

## 2023-10-30 ENCOUNTER — Other Ambulatory Visit: Payer: Self-pay | Admitting: Cardiology

## 2023-11-02 ENCOUNTER — Other Ambulatory Visit: Payer: Self-pay | Admitting: Internal Medicine

## 2023-11-02 DIAGNOSIS — K2101 Gastro-esophageal reflux disease with esophagitis, with bleeding: Secondary | ICD-10-CM

## 2023-12-04 ENCOUNTER — Ambulatory Visit: Admitting: Gastroenterology

## 2023-12-29 ENCOUNTER — Ambulatory Visit: Admitting: Gastroenterology

## 2024-01-23 ENCOUNTER — Ambulatory Visit: Admitting: Gastroenterology

## 2024-02-13 ENCOUNTER — Other Ambulatory Visit: Payer: Self-pay | Admitting: Internal Medicine

## 2024-02-13 ENCOUNTER — Encounter: Payer: Self-pay | Admitting: Internal Medicine

## 2024-02-13 DIAGNOSIS — K2101 Gastro-esophageal reflux disease with esophagitis, with bleeding: Secondary | ICD-10-CM

## 2024-02-13 NOTE — Telephone Encounter (Signed)
 FYI

## 2024-02-14 ENCOUNTER — Other Ambulatory Visit: Payer: Self-pay | Admitting: Internal Medicine

## 2024-02-14 DIAGNOSIS — Z202 Contact with and (suspected) exposure to infections with a predominantly sexual mode of transmission: Secondary | ICD-10-CM | POA: Insufficient documentation

## 2024-02-14 DIAGNOSIS — Z114 Encounter for screening for human immunodeficiency virus [HIV]: Secondary | ICD-10-CM | POA: Insufficient documentation

## 2024-02-17 ENCOUNTER — Other Ambulatory Visit: Payer: Self-pay | Admitting: Cardiology

## 2024-02-19 NOTE — Telephone Encounter (Signed)
 This would be a HIPAA violation

## 2024-02-26 ENCOUNTER — Other Ambulatory Visit

## 2024-02-26 DIAGNOSIS — Z114 Encounter for screening for human immunodeficiency virus [HIV]: Secondary | ICD-10-CM

## 2024-02-26 DIAGNOSIS — Z202 Contact with and (suspected) exposure to infections with a predominantly sexual mode of transmission: Secondary | ICD-10-CM

## 2024-02-28 IMAGING — CT CT CHEST LUNG CANCER SCREENING LOW DOSE W/O CM
1 series · 10 of 10 positions shown, 13 images · non-contrast
Comparison: Cardiac CT 01/15/2021. Low-dose lung cancer screening
chest CT 08/03/2020.

CLINICAL DATA: 56-year-old male current smoker with 31 pack-year
history of smoking. Lung cancer screening examination.



[ct lung segmentation data · axial · 0.70mm/px · z∈[-182,-182]mm · 10 of 329 frames shown]
[frame 1/329  mediastinal]
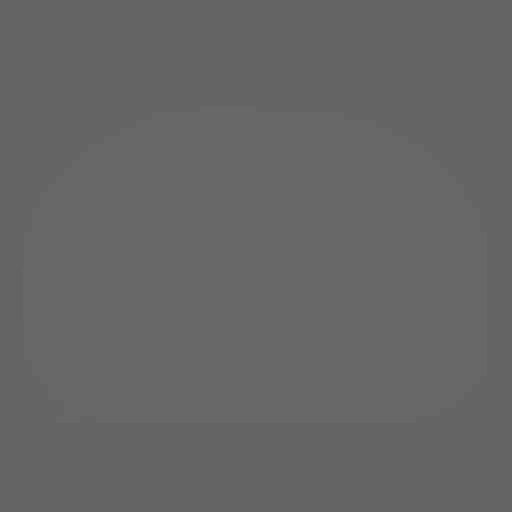
[frame 1/329  lung]
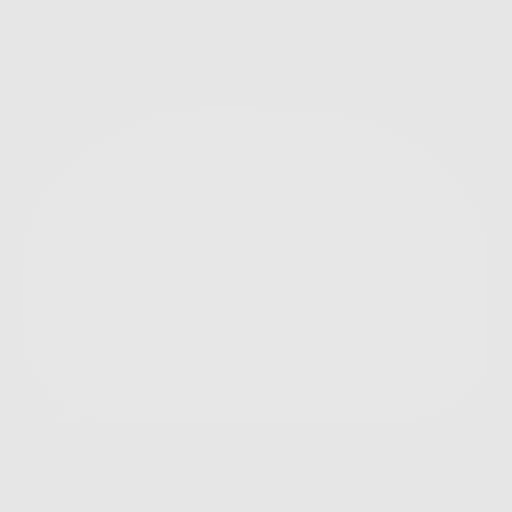
[frame 37/329  lung]
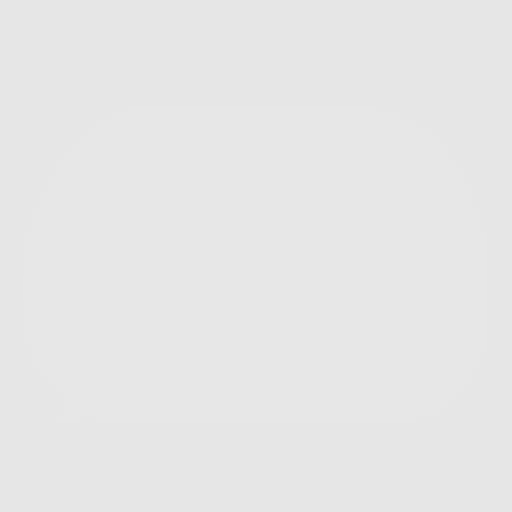
[frame 73/329  lung]
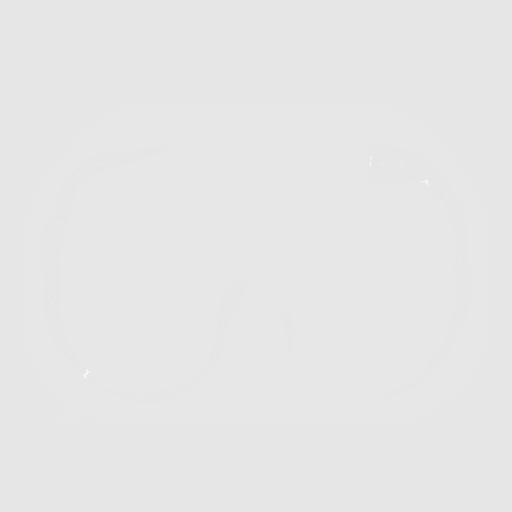
[frame 110/329  lung]
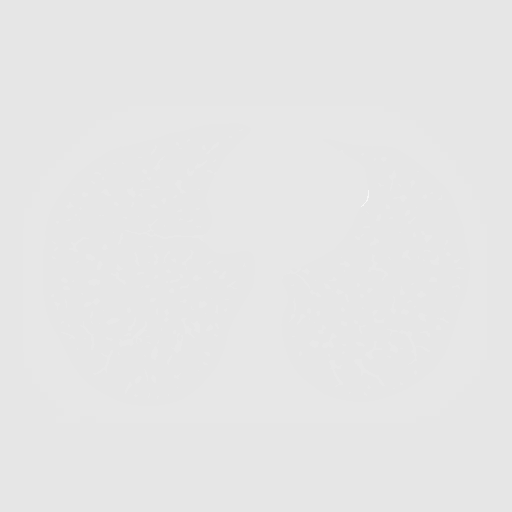
[frame 146/329  mediastinal]
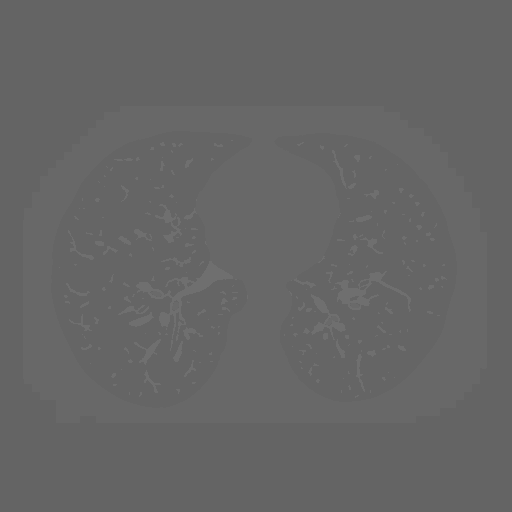
[frame 146/329  lung]
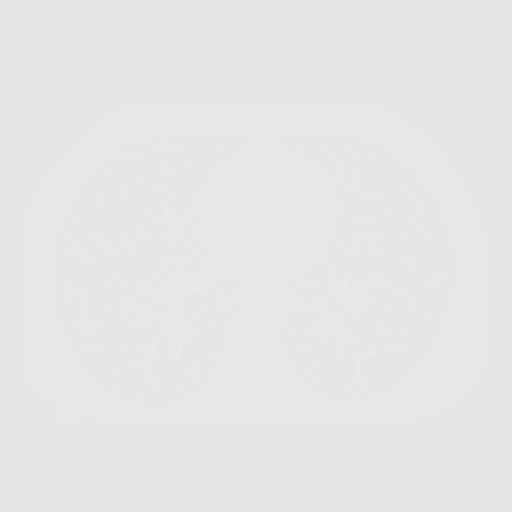
[frame 183/329  lung]
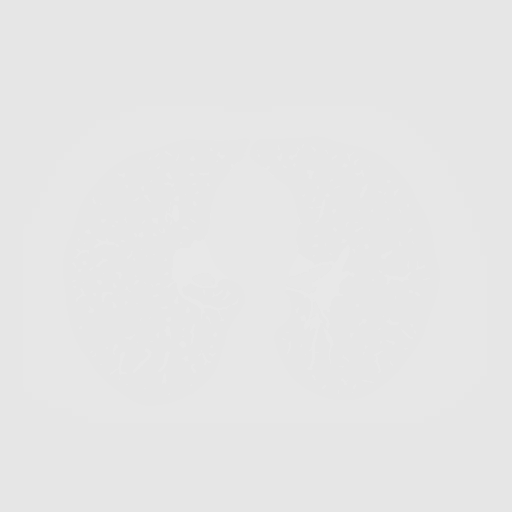
[frame 219/329  lung]
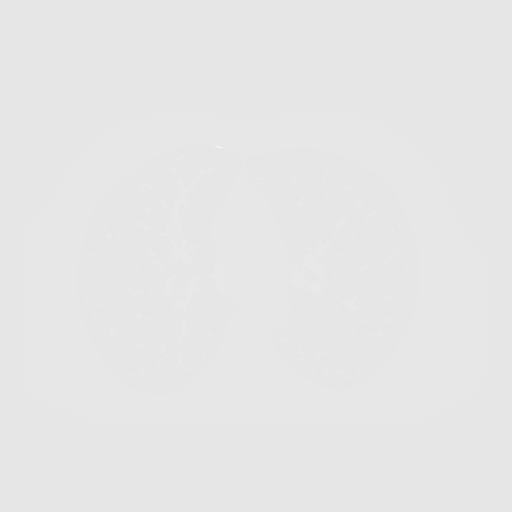
[frame 256/329  lung]
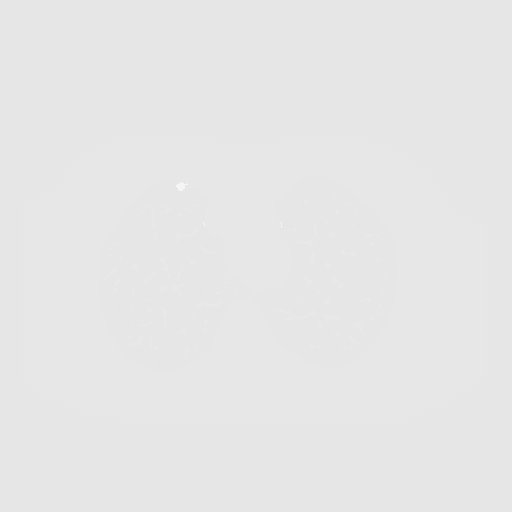
[frame 292/329  mediastinal]
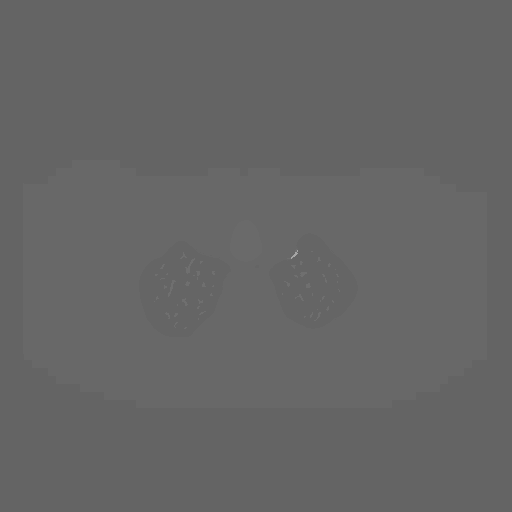
[frame 292/329  lung]
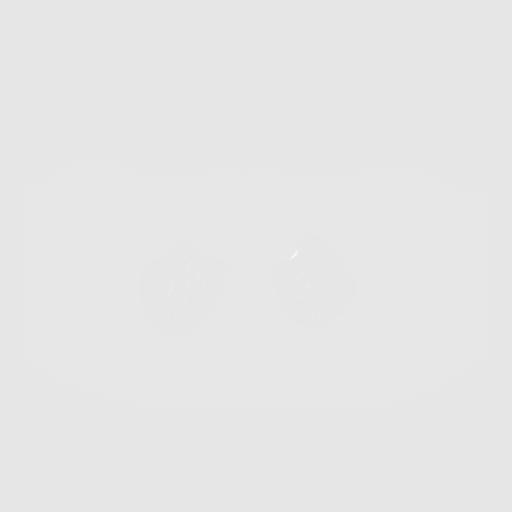
[frame 329/329  lung]
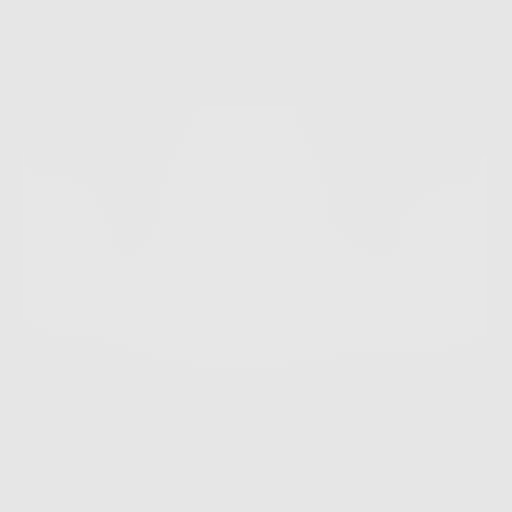

[10 of 10 positions shown; findings below may reference images not displayed]

FINDINGS: Cardiovascular: Heart size is normal. There is no significant
pericardial fluid, thickening or pericardial calcification. There is
aortic atherosclerosis, as well as atherosclerosis of the great
vessels of the mediastinum and the coronary arteries, including
calcified atherosclerotic plaque in the left anterior descending and
left circumflex coronary arteries.

Mediastinum/Nodes: No pathologically enlarged mediastinal or hilar
lymph nodes. Please note that accurate exclusion of hilar adenopathy
is limited on noncontrast CT scans. Esophagus is unremarkable in
appearance. No axillary lymphadenopathy.

Lungs/Pleura: No suspicious appearing pulmonary nodules or masses
are noted. No acute consolidative airspace disease. No pleural
effusions. Mild diffuse bronchial wall thickening with mild
centrilobular and paraseptal emphysema.

Upper Abdomen: Low-attenuation lesions are noted in both kidneys,
incompletely characterized on today's non-contrast CT examination,
but similar to prior studies, statistically likely to represent
cysts (no imaging follow-up is recommended), largest of which is in
the upper pole of the right kidney where there is a 5.1 x 4.6 cm
lesion. Aortic atherosclerosis.

Musculoskeletal: There are no aggressive appearing lytic or blastic
lesions noted in the visualized portions of the skeleton.
IMPRESSION: 1. Lung-RADS 2S, benign appearance or behavior. Continue annual
screening with low-dose chest CT without contrast in 12 months.
2. The "S" modifier above refers to potentially clinically
significant non lung cancer related findings. Specifically, there is
aortic atherosclerosis, in addition to two-vessel coronary artery
disease. Please note that although the presence of coronary artery
calcium documents the presence of coronary artery disease, the
severity of this disease and any potential stenosis cannot be
assessed on this non-gated CT examination. Assessment for potential
risk factor modification, dietary therapy or pharmacologic therapy
may be warranted, if clinically indicated.
3. Mild diffuse bronchial wall thickening with mild centrilobular
and paraseptal emphysema; imaging findings suggestive of underlying
COPD.

Aortic Atherosclerosis (ONXTE-QMO.O) and Emphysema (ONXTE-UOB.L).

## 2024-03-01 LAB — RPR: RPR Ser Ql: NONREACTIVE

## 2024-03-01 LAB — HIV ANTIBODY (ROUTINE TESTING W REFLEX)
HIV 1&2 Ab, 4th Generation: NONREACTIVE
HIV FINAL INTERPRETATION: NEGATIVE

## 2024-03-02 ENCOUNTER — Ambulatory Visit: Payer: Self-pay | Admitting: Internal Medicine

## 2024-03-05 ENCOUNTER — Other Ambulatory Visit: Payer: Self-pay | Admitting: Cardiology

## 2024-03-05 DIAGNOSIS — I2510 Atherosclerotic heart disease of native coronary artery without angina pectoris: Secondary | ICD-10-CM

## 2024-03-08 ENCOUNTER — Encounter: Payer: Self-pay | Admitting: Gastroenterology

## 2024-03-08 ENCOUNTER — Ambulatory Visit: Admitting: Gastroenterology

## 2024-03-08 VITALS — BP 110/70 | HR 60 | Ht 64.5 in | Wt 149.1 lb

## 2024-03-08 DIAGNOSIS — K219 Gastro-esophageal reflux disease without esophagitis: Secondary | ICD-10-CM | POA: Diagnosis not present

## 2024-03-08 DIAGNOSIS — Z860100 Personal history of colon polyps, unspecified: Secondary | ICD-10-CM

## 2024-03-08 DIAGNOSIS — Z800 Family history of malignant neoplasm of digestive organs: Secondary | ICD-10-CM | POA: Diagnosis not present

## 2024-03-08 MED ORDER — NA SULFATE-K SULFATE-MG SULF 17.5-3.13-1.6 GM/177ML PO SOLN: 1.0000 | Freq: Once | ORAL | 0 refills | Status: AC

## 2024-03-08 NOTE — Patient Instructions (Signed)
 We have sent the following medications to your pharmacy for you to pick up at your convenience: SUPREP  You have been scheduled for an endoscopy and colonoscopy. Please follow the written instructions given to you at your visit today.  If you use inhalers (even only as needed), please bring them with you on the day of your procedure.  DO NOT TAKE 7 DAYS PRIOR TO TEST- Trulicity (dulaglutide) Ozempic, Wegovy (semaglutide) Mounjaro, Zepbound (tirzepatide) Bydureon Bcise (exanatide extended release)  DO NOT TAKE 1 DAY PRIOR TO YOUR TEST Rybelsus (semaglutide) Adlyxin (lixisenatide) Victoza (liraglutide) Byetta (exanatide) ___________________________________________________________________________  Due to recent changes in healthcare laws, you may see the results of your imaging and laboratory studies on MyChart before your provider has had a chance to review them.  We understand that in some cases there may be results that are confusing or concerning to you. Not all laboratory results come back in the same time frame and the provider may be waiting for multiple results in order to interpret others.  Please give us  48 hours in order for your provider to thoroughly review all the results before contacting the office for clarification of your results.   _______________________________________________________  If your blood pressure at your visit was 140/90 or greater, please contact your primary care physician to follow up on this.  _______________________________________________________  If you are age 20 or older, your body mass index should be between 23-30. Your Body mass index is 25.2 kg/m. If this is out of the aforementioned range listed, please consider follow up with your Primary Care Provider.  If you are age 66 or younger, your body mass index should be between 19-25. Your Body mass index is 25.2 kg/m. If this is out of the aformentioned range listed, please consider follow up  with your Primary Care Provider.   ________________________________________________________  The [facility] providers would like to encourage you to use MYCHART to communicate with providers for non-urgent requests or questions.  Due to long hold times on the telephone, sending your provider a message by [facility] may be a faster and more efficient way to get a response.  Please allow 48 business hours for a response.  Please remember that this is for non-urgent requests.  _______________________________________________________  [facility] is using a team-based approach to care.  Your team is made up of your doctor and two to three APPS. Our APPS (Nurse Practitioners and Physician Assistants) work with your physician to ensure care continuity for you. They are fully qualified to address your health concerns and develop a treatment plan. They communicate directly with your gastroenterologist to care for you. Seeing the Advanced Practice Practitioners on your physician's team can help you by facilitating care more promptly, often allowing for earlier appointments, access to diagnostic testing, procedures, and other specialty referrals.   Thank you for trusting me with your gastrointestinal care. Deanna May, FNP-C

## 2024-03-08 NOTE — Progress Notes (Signed)
 Chief Complaint:colon polyps, colonoscopy Primary GI Doctor: (previously Dr. Eda) Dr. San   HPI:  Patient is a  58  year old male patient with past medical history of GERD,family history Colon CA, personal history of tubular adenomas, kidney stones, and  CAD, who was referred to me by Joshua Debby CROME, MD on 10/17/23 for a evaluation of colon polyps, colonoscopy .    07/07/23 Patient seen by cardiology, reviewed entire note.  Interval History  Patient last seen in GI office by Dr. Eda on 01/29/21.  Patient has history of GERD and currently taking Pantoprazole  40mg  po daily. He has occasional pyrosis. Patient denies dysphagia. Patient denies nausea, vomiting. Weight stable. Appetite good.   Patient denies altered bowel habits, abdominal pain, or rectal bleeding.  No alcohol use. Daily smoker 1/4 pack day.  Patient on Plavix  75mg  po daily and ASA 81 mg.  Surgical history: appendectomy  Patient's family history includes: father with colon cancer at age 61, passed away at 74.  GI procedures: Colonoscopy in  [place]  in 2016: 5 tubular adenomas on colonoscopy  11/30/20 colonoscopy: 3 tubular adenomas on colonoscopy  11/30/20 [facility]:Wnmfjo esophagus. Biopsied. Mild gastritis. Biopsied. Normal examined duodenum. Path: Diagnosis 1. Surgical [P], gastric antrum - ANTRAL MUCOSA WITH HYPEREMIA. - WARTHIN-STARRY NEGATIVE FOR HELICOBACTER PYLORI. - NO INTESTINAL METAPLASIA, DYSPLASIA OR CARCINOMA. 2. Surgical [P], gastric body - gastritis - OXYNTIC MUCOSA WITH HYPEREMIA. - WARTHIN-STARRY NEGATIVE FOR HELICOBACTER PYLORI. - NO INTESTINAL METAPLASIA, DYSPLASIA OR CARCINOMA. 3. Surgical [P], fundus - OXYNTIC MUCOSA WITH HYPEREMIA. - WARTHIN-STARRY NEGATIVE FOR HELICOBACTER PYLORI. - NO INTESTINAL METAPLASIA, DYSPLASIA OR CARCINOMA. 4. Surgical [P], distal esophagus - GASTROESOPHAGEAL MUCOSA WITH MILD INFLAMMATION CONSISTENT WITH REFLUX. - NO INTESTINAL METAPLASIA, DYSPLASIA OR  CARCINOMA. 5. Surgical [P], mid & proximal esophagus - UNREMARKABLE SQUAMOUS MUCOSA. - NO EOSINOPHILIC ESOPHAGITIS (LESS THAN 2 PER HIGH POWER FIELD).  Wt Readings from Last 3 Encounters:  03/08/24 149 lb 2 oz (67.6 kg)  10/12/23 142 lb 6.4 oz (64.6 kg)  07/07/23 149 lb (67.6 kg)    Past Medical History:  Diagnosis Date   Anxiety    Bipolar 1 disorder (HCC)    CAD (coronary artery disease)    Depressed    Eating disorder    bulimia   Family history of colon cancer in father    GERD (gastroesophageal reflux disease)    History of colon polyps    History of emergence delirium    PTSD history from childhood.   Hypertension    Hypogonadism in male    MI (myocardial infarction) (HCC)    PTSD (post-traumatic stress disorder)    Pt reports history of trauma in his past. Emergence delirium.   Stroke [facility])    Thyroid nodule     Past Surgical History:  Procedure Laterality Date   APPENDECTOMY  1998   COLONOSCOPY     CORONARY ANGIOPLASTY WITH STENT PLACEMENT     NASAL SEPTOPLASTY W/ TURBINOPLASTY Bilateral 12/14/2022   Procedure: NASAL SEPTOPLASTY WITH TURBINATE REDUCTION;  Surgeon: Luciano Standing, MD;  Location: [facility];  Service: ENT;  Laterality: Bilateral;   NASAL SINUS SURGERY Bilateral 12/14/2022   Procedure: FUNCTIONAL ENDOSCOPIC SINUS SURGERY (MAXILLARY ANTROSTOMIES; ANTERIOR ETHMOIDECTOMIES);  Surgeon: Luciano Standing, MD;  Location: [facility];  Service: ENT;  Laterality: Bilateral;   ROTATOR CUFF REPAIR Left    with screws   UPPER GASTROINTESTINAL ENDOSCOPY      Current Outpatient Medications  Medication Sig Dispense Refill  Albuterol-Budesonide (AIRSUPRA ) 90-80 MCG/ACT AERO Inhale 2 Inhalations into the lungs every 4 (four) hours as needed. 10 g 5   Ascorbic Acid (VITAMIN C PO) Take 2 tablets by mouth daily.     aspirin 81 MG chewable tablet Chew by mouth daily.     atorvastatin  (LIPITOR) 80 MG tablet Take 1 tablet (80 mg total) by  mouth daily. 90 tablet 3   budesonide (PULMICORT) 0.5 MG/2ML nebulizer solution Take 0.5 mg by nebulization as needed.     cholecalciferol (VITAMIN D3) 25 MCG (1000 UNIT) tablet Take 1,000 Units by mouth daily.     ciclopirox (LOPROX) 0.77 % SUSP Apply 1 Application topically daily as needed.     clonazePAM (KLONOPIN) 0.5 MG tablet Take 0.5 mg by mouth as needed for anxiety.     clopidogrel  (PLAVIX ) 75 MG tablet TAKE 1 TABLET BY MOUTH EVERY DAY 90 tablet 1   desonide (DESOWEN) 0.05 % lotion Apply topically daily as needed.     empagliflozin  (JARDIANCE ) 10 MG TABS tablet Take 1 tablet (10 mg total) by mouth daily before breakfast. 90 tablet 1   hydrocortisone  (ANUSOL -HC) 2.5 % rectal cream Place 1 application rectally 2 (two) times daily. 30 g 1   lamoTRIgine  (LAMICTAL ) 200 MG tablet Take 200 mg by mouth daily.     lithium  carbonate (LITHOBID) 300 MG ER tablet Take 600 mg by mouth daily.     Multiple Vitamins-Minerals (CENTRUM SILVER MEN 50+ PO) Take 1 tablet by mouth daily.     nitroGLYCERIN  (NITROSTAT ) 0.4 MG SL tablet PLACE 1 TABLET UNDER THE TONGUE EVERY 5 MINUTES AS NEEDED FOR CHEST PAIN. 100 tablet 2   pantoprazole  (PROTONIX ) 40 MG tablet TAKE 1 TABLET (40 MG TOTAL) BY MOUTH TWICE A DAY BEFORE MEALS 180 tablet 0   sildenafil  (REVATIO ) 20 MG tablet Take 4 tablets (80 mg total) by mouth daily as needed. 60 tablet 2   testosterone  (ANDROGEL ) 50 MG/5GM (1%) GEL PLACE 5GRAMS ONTO THE SKIN DAILY. 450 g 0   tretinoin (RETIN-A) 0.05 % cream Apply 1 Application topically at bedtime.     TRINTELLIX 10 MG TABS tablet Take 10 mg by mouth daily.     No current facility-administered medications for this visit.    Allergies as of 03/08/2024   (No Known Allergies)    Family History  Problem Relation Age of Onset   Thyroid disease Mother    Depression Mother    Hypertension Mother    Sudden Cardiac Death Mother    Colon cancer Father    Liver cancer Father    Esophageal cancer Neg Hx    Rectal  cancer Neg Hx    Stomach cancer Neg Hx     Review of Systems:    Constitutional: No weight loss, fever, chills, weakness or fatigue HEENT: Eyes: No change in vision               Ears, Nose, Throat:  No change in hearing or congestion Skin: No rash or itching Cardiovascular: No chest pain, chest pressure or palpitations   Respiratory: No SOB or cough Gastrointestinal: See HPI and otherwise negative Genitourinary: No dysuria or change in urinary frequency Neurological: No headache, dizziness or syncope Musculoskeletal: No new muscle or joint pain Hematologic: No bleeding or bruising Psychiatric: No history of depression or anxiety    Physical Exam:  Vital signs: BP 110/70 (BP Location: Left Arm, Patient Position: Sitting, Cuff Size: Normal)   Pulse 60   Ht 5' 4.5 (1.638  m) Comment: height measured without shoes  Wt 149 lb 2 oz (67.6 kg)   BMI 25.20 kg/m   Constitutional:   Pleasant male appears to be in NAD, Well developed, Well nourished, alert and cooperative Throat: Oral cavity and pharynx without inflammation, swelling or lesion.  Respiratory: Respirations even and unlabored. Lungs clear to auscultation bilaterally.   No wheezes, crackles, or rhonchi.  Cardiovascular: Normal S1, S2. Regular rate and rhythm. No peripheral edema, cyanosis or pallor.  Gastrointestinal:  Soft, nondistended, nontender. No rebound or guarding. Normal bowel sounds. No appreciable masses or hepatomegaly. Rectal:  Not performed.  Msk:  Symmetrical without gross deformities. Without edema, no deformity or joint abnormality.  Neurologic:  Alert and  oriented x4;  grossly normal neurologically.  Skin:   Dry and intact without significant lesions or rashes.  RELEVANT LABS AND IMAGING: CBC    Latest Ref Rng & Units 10/12/2023    4:05 PM 12/01/2022    9:53 AM 06/20/2022    8:05 PM  CBC  WBC 4.0 - 10.5 K/uL 7.6  6.5  6.7   Hemoglobin 13.0 - 17.0 g/dL 86.6  85.7  85.1   Hematocrit 39.0 - 52.0 % 40.9   43.9  44.7   Platelets 150.0 - 400.0 K/uL 329.0  286.0  354      CMP     Latest Ref Rng & Units 10/12/2023    4:05 PM 12/01/2022    9:53 AM 06/20/2022    8:05 PM  CMP  Glucose 70 - 99 mg/dL 78  897  94   BUN 6 - 23 mg/dL 21  18  24    Creatinine 0.40 - 1.50 mg/dL 8.65  8.55  8.45   Sodium 135 - 145 mEq/L 141  141  136   Potassium 3.5 - 5.1 mEq/L 4.2  4.3  4.0   Chloride 96 - 112 mEq/L 107  108  103   CO2 19 - 32 mEq/L 29  27  23    Calcium  8.4 - 10.5 mg/dL 89.9  89.8  89.7   Total Protein 6.0 - 8.3 g/dL 7.0  6.5    Total Bilirubin 0.2 - 1.2 mg/dL 0.6  0.4    Alkaline Phos 39 - 117 U/L 97  89    AST 0 - 37 U/L 19  17    ALT 0 - 53 U/L 22  17       Lab Results  Component Value Date   TSH 0.43 10/12/2023     Assessment/Plan: Encounter Diagnoses  Name Primary?   Gastroesophageal reflux disease, unspecified whether esophagitis present Yes   Family history of colon cancer    History of colonic polyps   58 year old male patient with history of GERD, family history Colon CA, personal history of tubular adenomas  #1 GERD, intermittent symptoms - on PPI therapy - reflux lifestyle modifications -samples relfux gourmet -Scheduled EGD in [facility] with Dr. San. The risks and benefits of EGD with possible biopsies and esophageal dilation were discussed with the patient who agrees to proceed. #2 Personal hx of tubular adenomas 5 tubular adenomas on colonoscopy in [place]  in 2016 3 tubular adenomas on colonoscopy 11/30/20 #3 Family HX of Colon CA (father diagnosed at 72) -Schedule for a colonoscopy in [facility] with Dr. San. The risks and benefits of colonoscopy with possible polypectomy / biopsies were discussed and the patient agrees to proceed.  -cardiac clearance for Plavix  -Hold Plavix   5 days before procedure - will instruct when  and how to resume after procedure. Risks and benefits of procedure including bleeding, perforation, infection, missed lesions, medication reactions and  possible hospitalization or surgery if complications occur explained. Additional rare but real risk of cardiovascular event such as heart attack or ischemia/infarct of other organs off Plavix  explained and need to seek urgent help if this occurs. Will communicate by phone or EMR with patient's prescribing provider that to confirm holding Plavix  is reasonable in this case.   #4 CAD -on baby ASA and Plavix   Thank you for the courtesy of this consult. Please call me with any questions or concerns.   Elizabeth Haff, FNP-C  Gastroenterology 03/08/2024, 10:27 AM  Cc: Joshua Debby CROME, MD

## 2024-03-17 NOTE — Progress Notes (Signed)
 Agree with the assessment and plan as outlined by [facility], FNP-C.  Carlitos Bottino, DO, [facility]

## 2024-03-28 ENCOUNTER — Other Ambulatory Visit: Payer: Self-pay | Admitting: Internal Medicine

## 2024-04-02 ENCOUNTER — Encounter: Payer: Self-pay | Admitting: Gastroenterology

## 2024-04-08 ENCOUNTER — Telehealth: Payer: Self-pay | Admitting: Gastroenterology

## 2024-04-08 ENCOUNTER — Other Ambulatory Visit: Payer: Self-pay | Admitting: Gastroenterology

## 2024-04-08 DIAGNOSIS — Z800 Family history of malignant neoplasm of digestive organs: Secondary | ICD-10-CM

## 2024-04-08 DIAGNOSIS — Z860100 Personal history of colon polyps, unspecified: Secondary | ICD-10-CM

## 2024-04-08 MED ORDER — SUTAB 1479-225-188 MG PO TABS: 24.0000 | ORAL_TABLET | Freq: Once | ORAL | 0 refills | Status: AC

## 2024-04-08 NOTE — Telephone Encounter (Addendum)
 PT requested for the SUTAB  instead of SUPREP due to liquid prep making him nauseous. SUTAB  prescription sent to pt pharmacy. Pt also stated that he took his plavix  last night. Per MD advised pt that we could proceed with the procedure, but if there were any large polyps we could not remove. Pt opted to reschedule for a later date. Pt has been rescheduled to 05/06/24 at 2pm, new instructions sent via mychart. Pt verbalized understanding and had no further concerns at the end of the call.

## 2024-04-08 NOTE — Telephone Encounter (Signed)
 Incoming call from pt about prep medications for tomorrows colonoscopy. Pt requesting pills instead of liquid. Pt stated it makes him nausea and cannot finish it all. Please advise

## 2024-04-09 ENCOUNTER — Encounter: Admitting: Gastroenterology

## 2024-04-14 ENCOUNTER — Other Ambulatory Visit: Payer: Self-pay | Admitting: Internal Medicine

## 2024-04-14 DIAGNOSIS — N1831 Chronic kidney disease, stage 3a: Secondary | ICD-10-CM

## 2024-04-27 ENCOUNTER — Encounter: Payer: Self-pay | Admitting: Cardiology

## 2024-05-01 ENCOUNTER — Telehealth: Payer: Self-pay

## 2024-05-01 ENCOUNTER — Encounter: Payer: Self-pay | Admitting: Gastroenterology

## 2024-05-01 NOTE — Telephone Encounter (Signed)
"  ° °  Patient Name: Louis Cardenas  DOB: 1965-05-21 MRN: [id number]  Primary Cardiologist: Lynwood Schilling, MD  Chart reviewed as part of pre-operative protocol coverage. Patient previously had a stent placed to the mid LAD in 2011. OK to hold plavix  for 5 days prior to endoscopy/colonoscopy as requested. Regarding ASA therapy, we recommend continuation of ASA throughout the perioperative period.  However, if the surgeon feels that cessation of ASA is required in the perioperative period, it may be stopped 5-7 days prior to surgery with a plan to resume it as soon as felt to be feasible from a surgical standpoint in the post-operative period.    [name], PA-C 05/01/2024, 1:30 PM   "

## 2024-05-01 NOTE — Telephone Encounter (Signed)
 [facility] Pre-operative Risk Assessment     Request for surgical clearance:     Endoscopy Procedure  What type of surgery is being performed?     Endo/colon  When is this surgery scheduled?     05/06/24  What type of clearance is required ?   Pharmacy  Are there any medications that need to be held prior to surgery and how long? Plavix  5 days  Practice name and name of physician performing surgery?      [facility]  What is your office phone and fax number?      Phone- [phone]  Fax- [phone]  Anesthesia type (None, local, MAC, general) ?       MAC   Please route your response to Rock Mania RN

## 2024-05-01 NOTE — Telephone Encounter (Signed)
 Left message on pts voicemail and also replied back through mychart as we have been messaging earlier today.

## 2024-05-02 NOTE — Telephone Encounter (Signed)
 Noted

## 2024-05-02 NOTE — Telephone Encounter (Addendum)
 Call from pt requesting to reschedule upcoming procedure for later date. Pt stated he is uncomfortable moving forward with ENDOCOL until he is seen by Lynwood Schilling, MD. Pt scheduled for 07/17/2024; will reschedule once visit is completed. Please advise. Thank you

## 2024-05-06 ENCOUNTER — Encounter: Admitting: Gastroenterology

## 2024-05-11 ENCOUNTER — Other Ambulatory Visit: Payer: Self-pay | Admitting: Internal Medicine

## 2024-05-11 DIAGNOSIS — K2101 Gastro-esophageal reflux disease with esophagitis, with bleeding: Secondary | ICD-10-CM

## 2024-07-08 ENCOUNTER — Ambulatory Visit: Admitting: Cardiology

## 2024-07-17 ENCOUNTER — Encounter: Admitting: Gastroenterology
